# Patient Record
Sex: Female | Born: 1935 | Race: White | Hispanic: No | Smoking: Never smoker
Health system: Southern US, Community
[De-identification: ages and names within clinical notes are randomized; demographics above are authoritative.]

## PROBLEM LIST (undated history)

## (undated) DIAGNOSIS — K449 Diaphragmatic hernia without obstruction or gangrene: Secondary | ICD-10-CM

## (undated) DIAGNOSIS — K529 Noninfective gastroenteritis and colitis, unspecified: Secondary | ICD-10-CM

## (undated) DIAGNOSIS — R55 Syncope and collapse: Secondary | ICD-10-CM

## (undated) DIAGNOSIS — E871 Hypo-osmolality and hyponatremia: Secondary | ICD-10-CM

## (undated) DIAGNOSIS — R195 Other fecal abnormalities: Secondary | ICD-10-CM

## (undated) DIAGNOSIS — S52502A Unspecified fracture of the lower end of left radius, initial encounter for closed fracture: Secondary | ICD-10-CM

## (undated) DIAGNOSIS — N183 Chronic kidney disease, stage 3 unspecified: Secondary | ICD-10-CM

## (undated) DIAGNOSIS — R140 Abdominal distension (gaseous): Secondary | ICD-10-CM

## (undated) DIAGNOSIS — R7309 Other abnormal glucose: Secondary | ICD-10-CM

## (undated) DIAGNOSIS — M810 Age-related osteoporosis without current pathological fracture: Secondary | ICD-10-CM

## (undated) DIAGNOSIS — C439 Malignant melanoma of skin, unspecified: Secondary | ICD-10-CM

## (undated) DIAGNOSIS — I10 Essential (primary) hypertension: Secondary | ICD-10-CM

## (undated) DIAGNOSIS — R198 Other specified symptoms and signs involving the digestive system and abdomen: Secondary | ICD-10-CM

## (undated) DIAGNOSIS — F419 Anxiety disorder, unspecified: Secondary | ICD-10-CM

## (undated) DIAGNOSIS — E785 Hyperlipidemia, unspecified: Secondary | ICD-10-CM

## (undated) DIAGNOSIS — K219 Gastro-esophageal reflux disease without esophagitis: Secondary | ICD-10-CM

## (undated) DIAGNOSIS — E559 Vitamin D deficiency, unspecified: Secondary | ICD-10-CM

## (undated) HISTORY — DX: Abdominal distension (gaseous): R14.0

## (undated) HISTORY — DX: Other specified symptoms and signs involving the digestive system and abdomen: R19.8

## (undated) HISTORY — DX: Essential (primary) hypertension: I10

## (undated) HISTORY — PX: REPLACEMENT TOTAL KNEE: SUR1224

## (undated) HISTORY — DX: Malignant melanoma of skin, unspecified: C43.9

## (undated) HISTORY — DX: Noninfective gastroenteritis and colitis, unspecified: K52.9

## (undated) HISTORY — PX: WRIST SURGERY: SHX841

## (undated) HISTORY — PX: COLONOSCOPY: SHX174

## (undated) HISTORY — DX: Syncope and collapse: R55

## (undated) HISTORY — DX: Vitamin D deficiency, unspecified: E55.9

## (undated) HISTORY — DX: Age-related osteoporosis without current pathological fracture: M81.0

## (undated) HISTORY — PX: ABDOMINAL HYSTERECTOMY: SHX81

## (undated) HISTORY — DX: Hypo-osmolality and hyponatremia: E87.1

## (undated) HISTORY — DX: Unspecified fracture of the lower end of left radius, initial encounter for closed fracture: S52.502A

## (undated) HISTORY — PX: CATARACT EXTRACTION, BILATERAL: SHX1313

## (undated) HISTORY — DX: Gastro-esophageal reflux disease without esophagitis: K21.9

## (undated) HISTORY — DX: Anxiety disorder, unspecified: F41.9

## (undated) HISTORY — DX: Other fecal abnormalities: R19.5

## (undated) HISTORY — DX: Hyperlipidemia, unspecified: E78.5

## (undated) HISTORY — DX: Diaphragmatic hernia without obstruction or gangrene: K44.9

## (undated) HISTORY — DX: Chronic kidney disease, stage 3 unspecified: N18.30

## (undated) HISTORY — DX: Other abnormal glucose: R73.09

---

## 2002-05-26 HISTORY — PX: BREAST EXCISIONAL BIOPSY: SUR124

## 2010-07-03 ENCOUNTER — Other Ambulatory Visit: Payer: Self-pay | Admitting: Internal Medicine

## 2010-07-03 DIAGNOSIS — Z780 Asymptomatic menopausal state: Secondary | ICD-10-CM

## 2010-07-03 DIAGNOSIS — Z1231 Encounter for screening mammogram for malignant neoplasm of breast: Secondary | ICD-10-CM

## 2010-10-24 ENCOUNTER — Ambulatory Visit: Payer: Self-pay

## 2010-10-24 ENCOUNTER — Other Ambulatory Visit: Payer: Self-pay

## 2010-10-29 ENCOUNTER — Other Ambulatory Visit: Payer: Self-pay | Admitting: Family Medicine

## 2010-10-29 ENCOUNTER — Ambulatory Visit
Admission: RE | Admit: 2010-10-29 | Discharge: 2010-10-29 | Disposition: A | Discharge status: Home / Self Care | Payer: Federal, State, Local not specified - PPO | Source: Ambulatory Visit | Attending: Internal Medicine | Admitting: Internal Medicine

## 2010-10-29 ENCOUNTER — Ambulatory Visit
Admission: RE | Admit: 2010-10-29 | Discharge: 2010-10-29 | Disposition: A | Discharge status: Home / Self Care | Payer: Medicare Other | Source: Ambulatory Visit | Attending: Internal Medicine | Admitting: Internal Medicine

## 2010-10-29 DIAGNOSIS — Z780 Asymptomatic menopausal state: Secondary | ICD-10-CM

## 2010-10-29 DIAGNOSIS — Z1231 Encounter for screening mammogram for malignant neoplasm of breast: Secondary | ICD-10-CM

## 2011-10-21 ENCOUNTER — Other Ambulatory Visit: Payer: Self-pay | Admitting: Family Medicine

## 2011-10-21 DIAGNOSIS — Z1231 Encounter for screening mammogram for malignant neoplasm of breast: Secondary | ICD-10-CM

## 2011-11-05 ENCOUNTER — Ambulatory Visit
Admission: RE | Admit: 2011-11-05 | Discharge: 2011-11-05 | Disposition: A | Discharge status: Home / Self Care | Payer: Federal, State, Local not specified - PPO | Source: Ambulatory Visit | Attending: Family Medicine | Admitting: Family Medicine

## 2011-11-05 DIAGNOSIS — Z1231 Encounter for screening mammogram for malignant neoplasm of breast: Secondary | ICD-10-CM

## 2012-08-04 ENCOUNTER — Ambulatory Visit
Admission: RE | Admit: 2012-08-04 | Discharge: 2012-08-04 | Disposition: A | Discharge status: Home / Self Care | Payer: Federal, State, Local not specified - PPO | Source: Ambulatory Visit | Attending: Family Medicine | Admitting: Family Medicine

## 2012-08-04 ENCOUNTER — Other Ambulatory Visit: Payer: Self-pay | Admitting: Family Medicine

## 2012-08-04 DIAGNOSIS — M25552 Pain in left hip: Secondary | ICD-10-CM

## 2012-08-04 DIAGNOSIS — R05 Cough: Secondary | ICD-10-CM

## 2012-08-04 DIAGNOSIS — M25562 Pain in left knee: Secondary | ICD-10-CM

## 2012-08-04 IMAGING — CR DG CHEST 2V
2 series · 2 of 2 positions shown · non-contrast
Comparison: None.

CLINICAL DATA: Cough for 1 week

CHEST - 2 VIEW

[w chest pa]
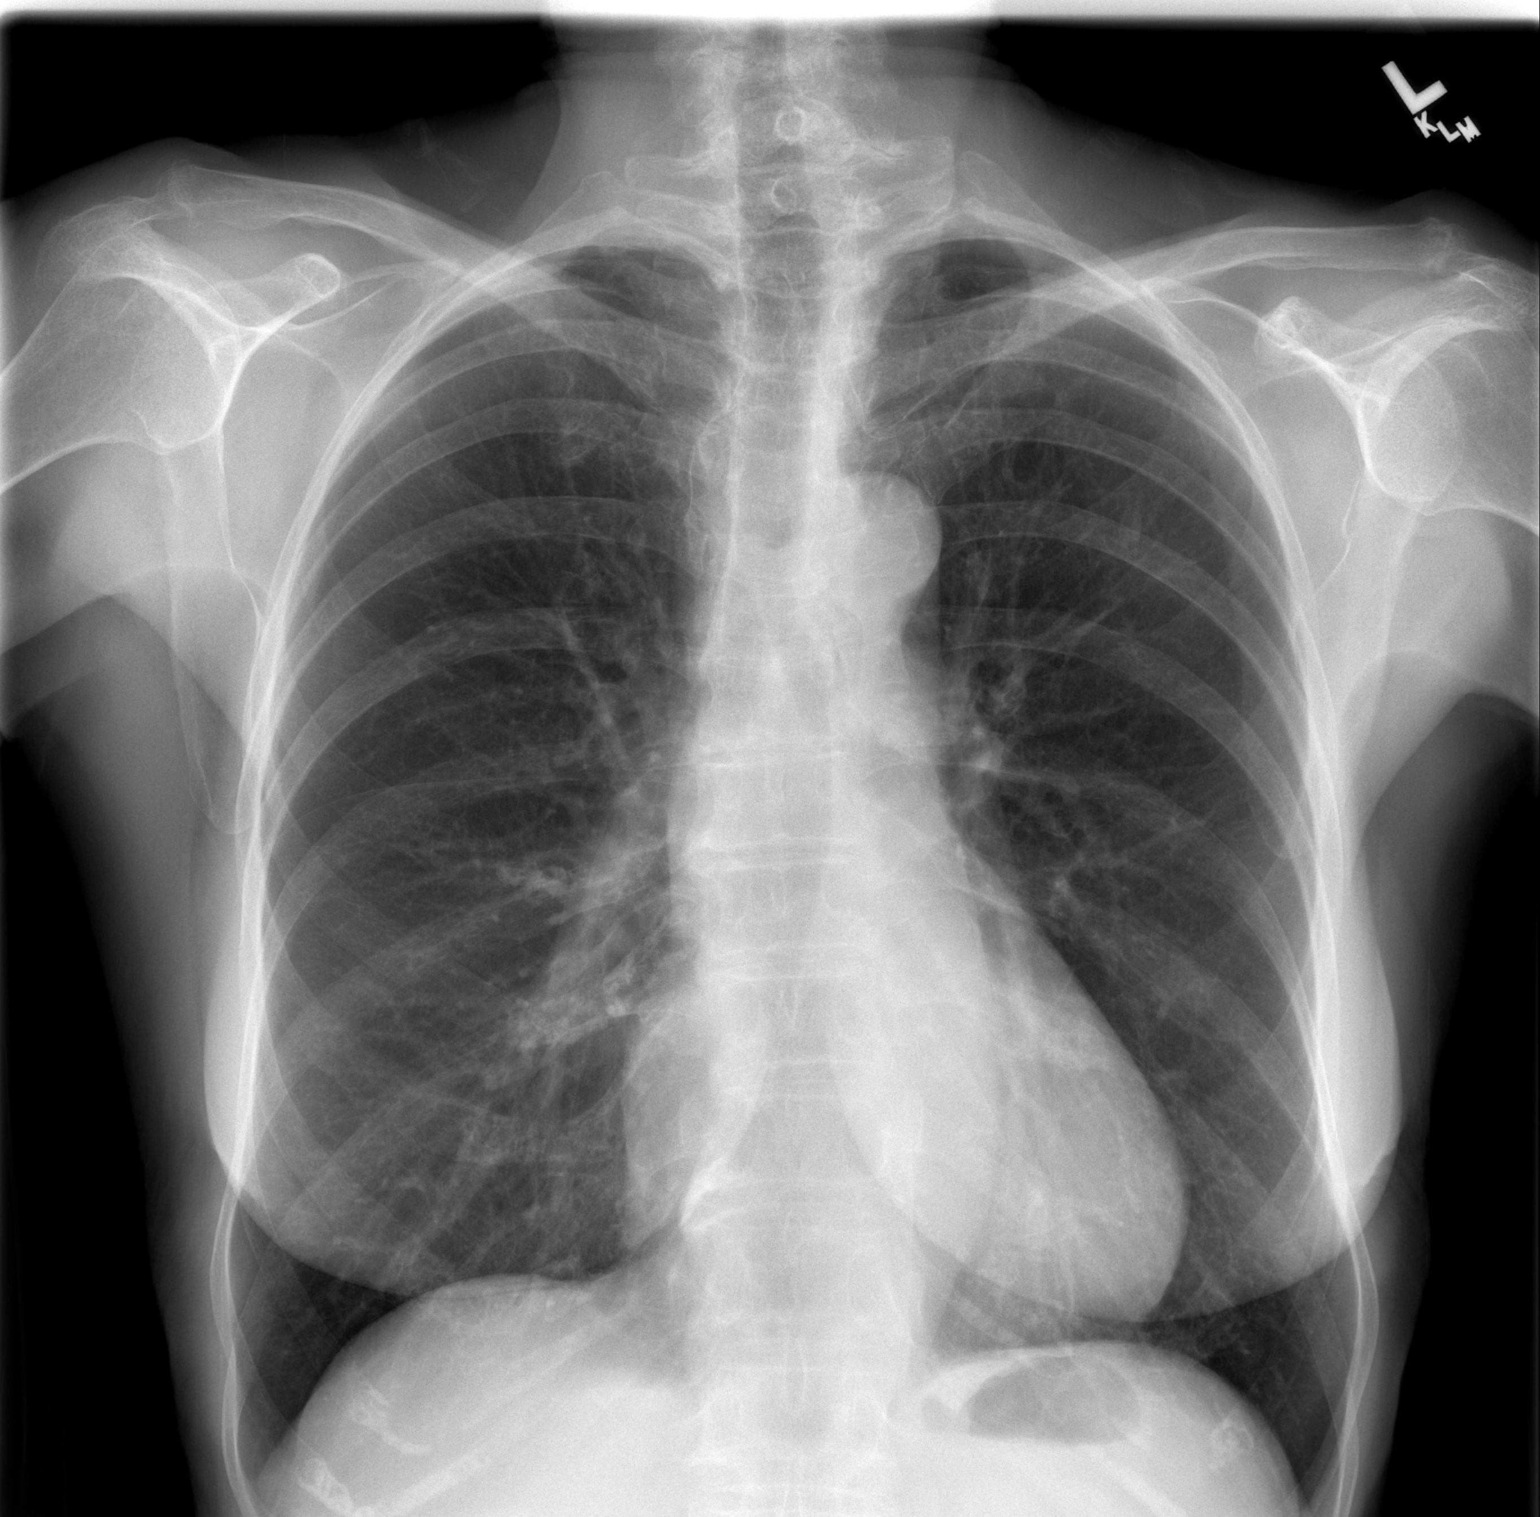

[w chest lat]
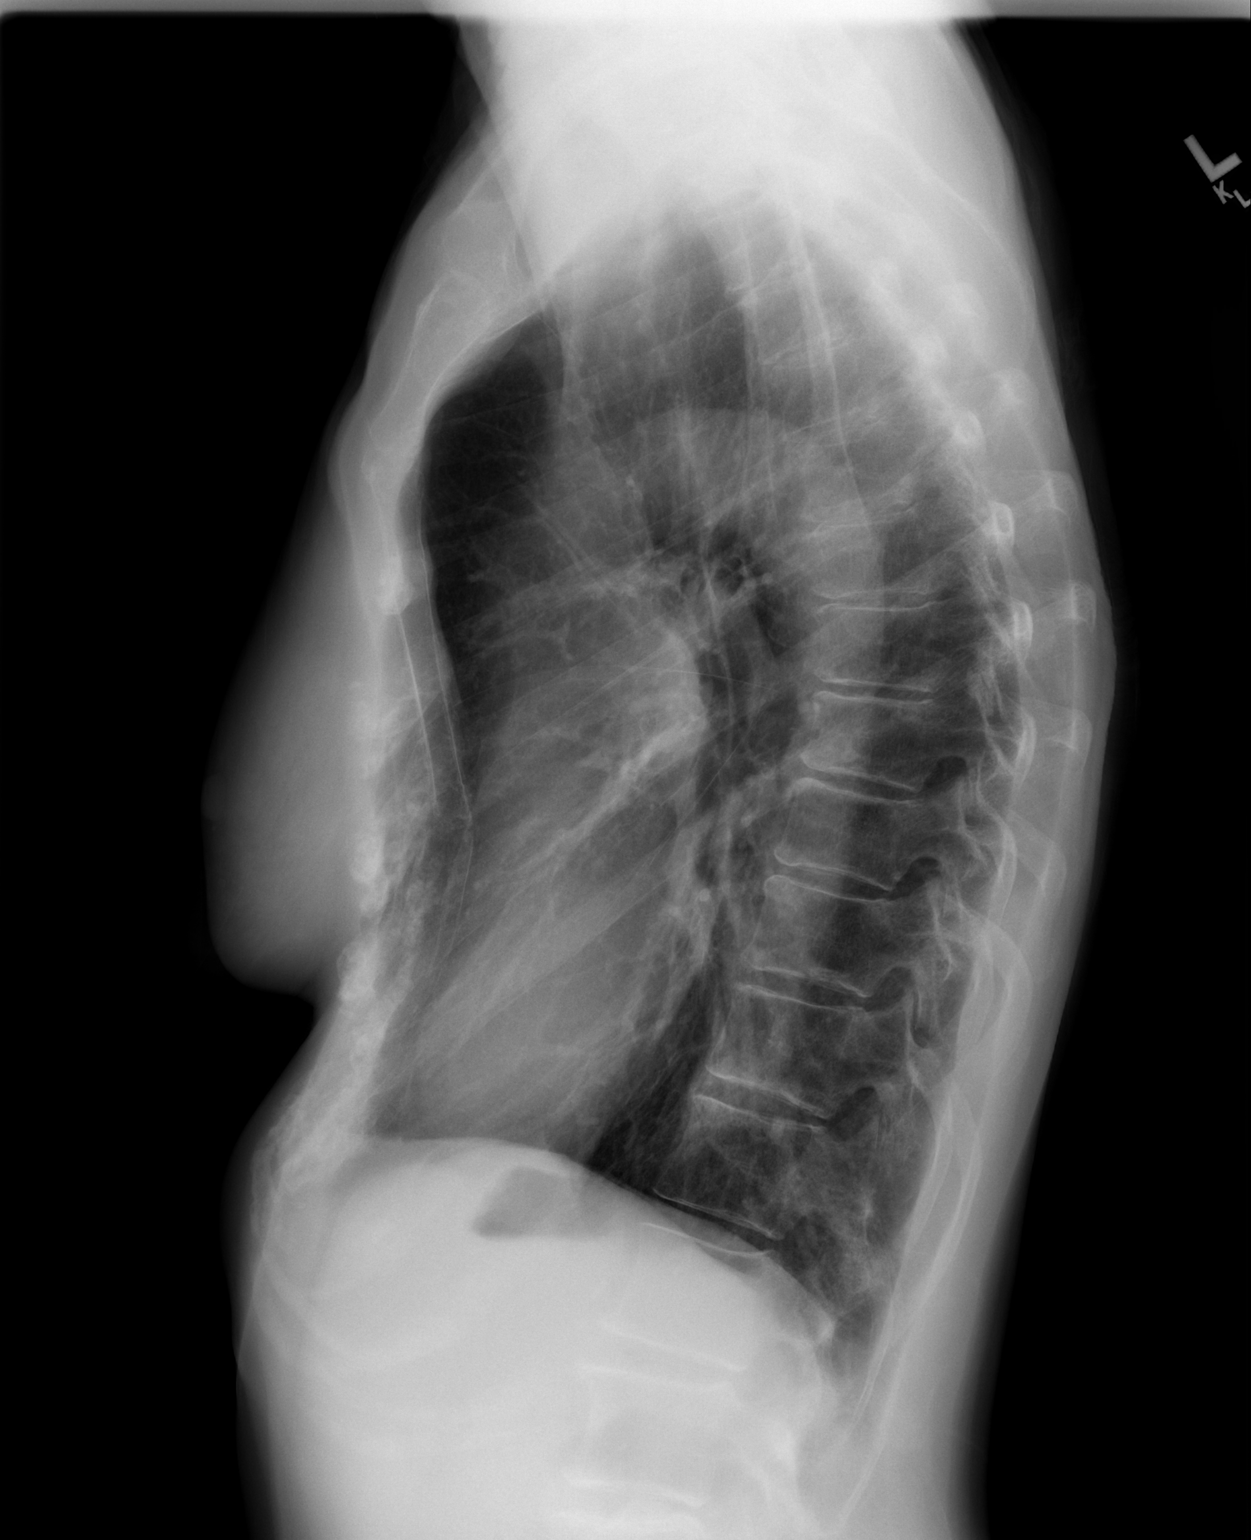

[2 of 2 positions shown; findings below may reference images not displayed]

FINDINGS: No active infiltrate or effusion is seen.  The lungs are
slightly hyperaerated.  Mediastinal contours appear normal.  The
heart is within upper limits of normal.  There does appear to be a
slight pectus excavatum present.  No acute bony abnormality is
seen.
IMPRESSION: 1.  No active lung disease.  Slight hyperaeration.
2.  Mild pectus excavatum.

## 2012-09-27 ENCOUNTER — Other Ambulatory Visit: Payer: Self-pay

## 2012-09-27 DIAGNOSIS — Z1231 Encounter for screening mammogram for malignant neoplasm of breast: Secondary | ICD-10-CM

## 2012-11-05 ENCOUNTER — Ambulatory Visit
Admission: RE | Admit: 2012-11-05 | Discharge: 2012-11-05 | Disposition: A | Discharge status: Home / Self Care | Payer: Federal, State, Local not specified - PPO | Source: Ambulatory Visit

## 2012-11-05 DIAGNOSIS — Z1231 Encounter for screening mammogram for malignant neoplasm of breast: Secondary | ICD-10-CM

## 2013-08-04 ENCOUNTER — Other Ambulatory Visit: Payer: Self-pay | Admitting: Family

## 2013-08-04 DIAGNOSIS — M8580 Other specified disorders of bone density and structure, unspecified site: Secondary | ICD-10-CM

## 2013-08-23 ENCOUNTER — Ambulatory Visit
Admission: RE | Admit: 2013-08-23 | Discharge: 2013-08-23 | Disposition: A | Discharge status: Home / Self Care | Payer: Federal, State, Local not specified - PPO | Source: Ambulatory Visit | Attending: Family | Admitting: Family

## 2013-08-23 DIAGNOSIS — M8580 Other specified disorders of bone density and structure, unspecified site: Secondary | ICD-10-CM

## 2013-10-24 ENCOUNTER — Other Ambulatory Visit: Payer: Self-pay

## 2013-10-24 DIAGNOSIS — Z1231 Encounter for screening mammogram for malignant neoplasm of breast: Secondary | ICD-10-CM

## 2013-11-15 ENCOUNTER — Encounter (INDEPENDENT_AMBULATORY_CARE_PROVIDER_SITE_OTHER): Payer: Self-pay

## 2013-11-15 ENCOUNTER — Ambulatory Visit
Admission: RE | Admit: 2013-11-15 | Discharge: 2013-11-15 | Disposition: A | Discharge status: Home / Self Care | Payer: Federal, State, Local not specified - PPO | Source: Ambulatory Visit

## 2013-11-15 DIAGNOSIS — Z1231 Encounter for screening mammogram for malignant neoplasm of breast: Secondary | ICD-10-CM

## 2014-10-20 ENCOUNTER — Other Ambulatory Visit: Payer: Self-pay

## 2014-10-20 DIAGNOSIS — Z1231 Encounter for screening mammogram for malignant neoplasm of breast: Secondary | ICD-10-CM

## 2014-11-17 ENCOUNTER — Ambulatory Visit
Admission: RE | Admit: 2014-11-17 | Discharge: 2014-11-17 | Disposition: A | Discharge status: Home / Self Care | Payer: Federal, State, Local not specified - PPO | Source: Ambulatory Visit

## 2014-11-17 DIAGNOSIS — Z1231 Encounter for screening mammogram for malignant neoplasm of breast: Secondary | ICD-10-CM

## 2015-07-07 ENCOUNTER — Observation Stay (HOSPITAL_COMMUNITY): Payer: Federal, State, Local not specified - PPO

## 2015-07-07 ENCOUNTER — Observation Stay (HOSPITAL_COMMUNITY)
Admission: EM | Admit: 2015-07-07 | Discharge: 2015-07-08 | Disposition: A | Discharge status: Home / Self Care | Payer: Federal, State, Local not specified - PPO | Attending: Orthopedic Surgery | Admitting: Orthopedic Surgery

## 2015-07-07 ENCOUNTER — Emergency Department (HOSPITAL_COMMUNITY): Payer: Federal, State, Local not specified - PPO

## 2015-07-07 ENCOUNTER — Encounter (HOSPITAL_COMMUNITY): Payer: Self-pay | Admitting: Emergency Medicine

## 2015-07-07 DIAGNOSIS — Z79899 Other long term (current) drug therapy: Secondary | ICD-10-CM | POA: Insufficient documentation

## 2015-07-07 DIAGNOSIS — M8580 Other specified disorders of bone density and structure, unspecified site: Secondary | ICD-10-CM | POA: Diagnosis not present

## 2015-07-07 DIAGNOSIS — W1830XA Fall on same level, unspecified, initial encounter: Secondary | ICD-10-CM | POA: Diagnosis not present

## 2015-07-07 DIAGNOSIS — S52572A Other intraarticular fracture of lower end of left radius, initial encounter for closed fracture: Principal | ICD-10-CM | POA: Insufficient documentation

## 2015-07-07 DIAGNOSIS — S52502A Unspecified fracture of the lower end of left radius, initial encounter for closed fracture: Secondary | ICD-10-CM

## 2015-07-07 DIAGNOSIS — S6992XA Unspecified injury of left wrist, hand and finger(s), initial encounter: Secondary | ICD-10-CM | POA: Diagnosis present

## 2015-07-07 DIAGNOSIS — Z01818 Encounter for other preprocedural examination: Secondary | ICD-10-CM

## 2015-07-07 HISTORY — DX: Unspecified fracture of the lower end of left radius, initial encounter for closed fracture: S52.502A

## 2015-07-07 LAB — CBC WITH DIFFERENTIAL/PLATELET
BASOS ABS: 0 10*3/uL (ref 0.0–0.1)
Basophils Relative: 1 %
EOS ABS: 0.1 10*3/uL (ref 0.0–0.7)
EOS PCT: 1 %
HCT: 45.8 % (ref 36.0–46.0)
Hemoglobin: 15.4 g/dL — ABNORMAL HIGH (ref 12.0–15.0)
LYMPHS PCT: 32 %
Lymphs Abs: 1.7 10*3/uL (ref 0.7–4.0)
MCH: 30.1 pg (ref 26.0–34.0)
MCHC: 33.6 g/dL (ref 30.0–36.0)
MCV: 89.5 fL (ref 78.0–100.0)
MONO ABS: 0.4 10*3/uL (ref 0.1–1.0)
Monocytes Relative: 8 %
Neutro Abs: 3.1 10*3/uL (ref 1.7–7.7)
Neutrophils Relative %: 58 %
PLATELETS: 185 10*3/uL (ref 150–400)
RBC: 5.12 MIL/uL — ABNORMAL HIGH (ref 3.87–5.11)
RDW: 13.3 % (ref 11.5–15.5)
WBC: 5.4 10*3/uL (ref 4.0–10.5)

## 2015-07-07 LAB — BASIC METABOLIC PANEL
ANION GAP: 12 (ref 5–15)
BUN: 13 mg/dL (ref 6–20)
CALCIUM: 9.6 mg/dL (ref 8.9–10.3)
CO2: 27 mmol/L (ref 22–32)
Chloride: 96 mmol/L — ABNORMAL LOW (ref 101–111)
Creatinine, Ser: 0.86 mg/dL (ref 0.44–1.00)
GFR calc Af Amer: >60 mL/min (ref >60)
GLUCOSE: 118 mg/dL — AB (ref 65–99)
Potassium: 3.9 mmol/L (ref 3.5–5.1)
Sodium: 135 mmol/L (ref 135–145)

## 2015-07-07 MED ORDER — FAMOTIDINE 20 MG PO TABS: 20.0000 mg | ORAL_TABLET | Freq: Two times a day (BID) | ORAL | Status: DC | PRN

## 2015-07-07 MED ORDER — METHOCARBAMOL 1000 MG/10ML IJ SOLN: 500.0000 mg | Freq: Four times a day (QID) | INTRAVENOUS | Status: DC | PRN

## 2015-07-07 MED ORDER — LACTATED RINGERS IV SOLN
INTRAVENOUS | Status: DC
  Administered 2015-07-08 (×2): via INTRAVENOUS

## 2015-07-07 MED ORDER — METHOCARBAMOL 500 MG PO TABS: 500.0000 mg | ORAL_TABLET | Freq: Four times a day (QID) | ORAL | Status: DC | PRN

## 2015-07-07 MED ORDER — DOCUSATE SODIUM 100 MG PO CAPS: 100.0000 mg | ORAL_CAPSULE | Freq: Two times a day (BID) | ORAL | Status: DC

## 2015-07-07 MED ORDER — ALPRAZOLAM 0.5 MG PO TABS
0.5000 mg | ORAL_TABLET | Freq: Four times a day (QID) | ORAL | Status: DC | PRN
  Administered 2015-07-08: 0.5 mg via ORAL
  Filled 2015-07-07: qty 1

## 2015-07-07 MED ORDER — MORPHINE SULFATE (PF) 2 MG/ML IV SOLN: 1.0000 mg | INTRAVENOUS | Status: DC | PRN

## 2015-07-07 MED ORDER — ONDANSETRON HCL 4 MG/2ML IJ SOLN: 4.0000 mg | Freq: Four times a day (QID) | INTRAMUSCULAR | Status: DC | PRN

## 2015-07-07 MED ORDER — LIDOCAINE HCL 2 % IJ SOLN
10.0000 mL | Freq: Once | INTRAMUSCULAR | Status: AC
  Administered 2015-07-07: 200 mg
  Filled 2015-07-07: qty 20

## 2015-07-07 MED ORDER — ONDANSETRON HCL 4 MG PO TABS: 4.0000 mg | ORAL_TABLET | Freq: Four times a day (QID) | ORAL | Status: DC | PRN

## 2015-07-07 MED ORDER — VITAMIN C 500 MG PO TABS: 1000.0000 mg | ORAL_TABLET | Freq: Every day | ORAL | Status: DC

## 2015-07-07 MED ORDER — OXYCODONE HCL 5 MG PO TABS
5.0000 mg | ORAL_TABLET | ORAL | Status: DC | PRN
Start: 2015-07-07 — End: 2015-07-08

## 2015-07-07 NOTE — H&P (Signed)
Ashley Solomon is an 81 y.o. female.   Chief Complaint: Left displaced distal radius fracture after a same level fall HPI: Left displaced distal radius fracture after same level fall with displacement. I've been asked to take over her care.  I discussed all issues. She denies neck back chest or nominal pain.  She is here with a friend. She lives with her husband. She is highly independent. She is 81 years of age. She is very healthy  History reviewed. No pertinent past medical history.  Past Surgical History  Procedure Laterality Date  . Replacement total knee Right   . Abdominal hysterectomy      No family history on file. Social History:  reports that she has never smoked. She does not have any smokeless tobacco history on file. She reports that she drinks alcohol. She reports that she does not use illicit drugs.  Allergies: No Known Allergies   (Not in a hospital admission)  No results found for this or any previous visit (from the past 48 hour(s)). Dg Wrist Complete Left  07/07/2015  CLINICAL DATA:  Recent fall on outstretched hand with pain and deformity EXAM: LEFT WRIST - COMPLETE 3+ VIEW COMPARISON:  None. FINDINGS: Comminuted fracture of the distal radius is noted with extension into the radiocarpal joint. Impaction and posterior angulation at the fracture site is noted as well. No definitive ulnar fracture is seen. No gross soft tissue abnormality seen. Carpal bones are intact pre IMPRESSION: Distal radial fracture with extension into the radiocarpal joint. Electronically Signed   By: Inez Catalina M.D.   On: 07/07/2015 17:55    Review of Systems  Constitutional: Negative.   HENT: Negative.   Eyes: Negative.   Respiratory: Negative.   Gastrointestinal: Negative.   Genitourinary: Negative.   Skin: Negative.   Neurological: Negative.   Endo/Heme/Allergies: Negative.   Psychiatric/Behavioral: Negative.     Blood pressure 187/99, pulse 90, temperature 98.9 F (37.2  C), temperature source Oral, resp. rate 18, height 5\' 6"  (1.676 m), weight 61.689 kg (136 lb), SpO2 96 %. Physical Exam  Patient has a displaced distal radius fracture with significant abnormality. She has no evidence of infection compartment syndrome or acute carpal tunnel syndrome. She is sensate has difficulty moving the fingers due to the significant fracture displacement.  I discussed her findings prepared for stem examination is benign right upper exam examination is benign no signs of infection.  The patient is alert and oriented in no acute distress. The patient complains of pain in the affected upper extremity.  The patient is noted to have a normal HEENT exam. Lung fields show equal chest expansion and no shortness of breath. Abdomen exam is nontender without distention. Lower extremity examination does not show any fracture dislocation or blood clot symptoms. Pelvis is stable and the neck and back are stable and nontender. Assessment/Plan Left distal raised fracture acutely displaced.  Procedure patient was given a hematoma block under sterile conditions after prep and drape following this underwent manipulative reduction. Fluoroscopy was performed in the room and excellent position on AP and lateral plane were observed. I was pleased with AP and lateral plane and the reduction.  Unfortunately she will have progressed mandatory collapse given her age maturity level and degree of osteopenia.  I discussed her definitive 6 sedation tomorrow morning and she desires to proceed.  I discussed risks and benefits of surgery. Our goal is to give her her geometry back with stabilization of the distal radius. Benefits were discussed  and all questions have been encouraged and answered. We are planning surgery for your upper extremity. The risk and benefits of surgery to include risk of bleeding, infection, anesthesia,  damage to normal structures and failure of the surgery to accomplish its  intended goals of relieving symptoms and restoring function have been discussed in detail. With this in mind we plan to proceed. I have specifically discussed with the patient the pre-and postoperative regime and the dos and don'ts and risk and benefits in great detail. Risk and benefits of surgery also include risk of dystrophy(CRPS), chronic nerve pain, failure of the healing process to go onto completion and other inherent risks of surgery The relavent the pathophysiology of the disease/injury process, as well as the alternatives for treatment and postoperative course of action has been discussed in great detail with the patient who desires to proceed.  We will do everything in our power to help you (the patient) restore function to the upper extremity. It is a pleasure to see this patient today.  Paulene Floor, MD 07/07/2015, 9:40 PM

## 2015-07-07 NOTE — Progress Notes (Signed)
Orthopedic Tech Progress Note Patient Details:  Genel Kucera April 24, 1935 [id number] Assisted Dr. Amedeo Plenty with fx. reduction and assisted with placement of fiberglass sugar tong splint to LUE.  Pulses, sensation, motion intact before and after splinting.  Capillary refill less than 2 seconds before and after splinting.   Placed splinted LUE in arm sling. Ortho Devices Type of Ortho Device: Sugartong splint, Arm sling Ortho Device/Splint Location: LLE Ortho Device/Splint Interventions: Application   Darrol Poke 07/07/2015, 10:51 PM

## 2015-07-07 NOTE — ED Notes (Signed)
Pt reports slip and fall about 1 hour PTA causing injury to left wrist. Pt has ice on at present.

## 2015-07-07 NOTE — ED Notes (Signed)
Hand surgeon at bedside. 

## 2015-07-07 NOTE — ED Provider Notes (Signed)
CSN: [id number]     Arrival date & time 07/07/15  1641 History   First MD Initiated Contact with Patient 07/07/15 1912     Chief Complaint  Patient presents with  . Fall  . Wrist Injury      Patient is a 81 y.o. female presenting with fall and wrist injury. The history is provided by the patient.  Fall This is a new problem. Pertinent negatives include no abdominal pain and no shortness of breath.  Wrist Injury  patient had a mechanical fall and landed on her left wrist. There is a deformity but skin is intact. No other injury. No numbness weakness. She has not seen a hand surgeon before. No other injury. No headache. She is not on anticoagulation.  History reviewed. No pertinent past medical history. Past Surgical History  Procedure Laterality Date  . Replacement total knee Right   . Abdominal hysterectomy     History reviewed. No pertinent family history. Social History  Substance Use Topics  . Smoking status: Never Smoker   . Smokeless tobacco: None  . Alcohol Use: Yes     Comment: seldom   OB History    No data available     Review of Systems  Constitutional: Negative for appetite change.  Respiratory: Negative for shortness of breath.   Gastrointestinal: Negative for abdominal pain.  Musculoskeletal: Positive for joint swelling.  Skin: Negative for wound.  Neurological: Negative for weakness and numbness.      Allergies  Review of patient's allergies indicates no known allergies.  Home Medications   Prior to Admission medications   Medication Sig Start Date End Date Taking? Authorizing Provider  Cholecalciferol (VITAMIN D PO) Take 1 capsule by mouth daily.   Yes Historical Provider, MD  DULoxetine (CYMBALTA) 30 MG capsule Take 30 mg by mouth daily. 06/22/15  Yes Historical Provider, MD  Fish Oil-Cholecalciferol (FISH OIL + D3 PO) Take 1 capsule by mouth daily.   Yes Historical Provider, MD  vitamin B-12 (CYANOCOBALAMIN) 250 MCG tablet Take 250 mcg by mouth  daily.   Yes Historical Provider, MD  cephALEXin (KEFLEX) 500 MG capsule Take 1 capsule (500 mg total) by mouth 4 (four) times daily. 07/08/15   Roseanne Kaufman, MD  oxyCODONE (OXY IR/ROXICODONE) 5 MG immediate release tablet Take 2 tablets (10 mg total) by mouth every 4 (four) hours as needed for moderate pain or severe pain. 07/08/15   Roseanne Kaufman, MD   BP 151/78 mmHg  Pulse 77  Temp(Src) 98.8 F (37.1 C) (Oral)  Resp 17  Ht 5\' 6"  (1.676 m)  Wt 135 lb 2.3 oz (61.3 kg)  BMI 21.82 kg/m2  SpO2 94% Physical Exam  Constitutional: She appears well-developed.  HENT:  Head: Atraumatic.  Eyes: EOM are normal.  Neck: Neck supple.  Cardiovascular: Normal rate.   Pulmonary/Chest: Effort normal.  Abdominal: Soft.  Musculoskeletal: She exhibits tenderness.  Tenderness and deformity of left wrist with dorsal angulation. Skin intact. Neurovascular intact over radial median and ulnar distribution of the hand. No tenderness over her elbow or shoulder.  Skin: Skin is warm.    ED Course  Procedures (including critical care time) Labs Review Labs Reviewed  CBC WITH DIFFERENTIAL/PLATELET - Abnormal; Notable for the following:    RBC 5.12 (*)    Hemoglobin 15.4 (*)    All other components within normal limits  BASIC METABOLIC PANEL - Abnormal; Notable for the following:    Chloride 96 (*)    Glucose, Bld 118 (*)  All other components within normal limits  MRSA PCR SCREENING    Imaging Review Dg Chest 2 View  07/07/2015  CLINICAL DATA:  Preoperative evaluation for wrist surgery. EXAM: CHEST  2 VIEW COMPARISON:  None. FINDINGS: The cardiac silhouette appears mildly enlarged, attributed to pectus excavatum. Mediastinal silhouette is nonsuspicious. Mild chronic interstitial changes and increased lung volumes suggest mild COPD. No pleural effusion or focal consolidation. No pneumothorax. Soft tissue planes and included osseous structure nonsuspicious, patient appears osteopenic. IMPRESSION: Mild  probable COPD without superimposed acute cardiopulmonary process. Electronically Signed   By: Elon Alas M.D.   On: 07/07/2015 22:31   Dg Wrist Complete Left  07/07/2015  CLINICAL DATA:  Recent fall on outstretched hand with pain and deformity EXAM: LEFT WRIST - COMPLETE 3+ VIEW COMPARISON:  None. FINDINGS: Comminuted fracture of the distal radius is noted with extension into the radiocarpal joint. Impaction and posterior angulation at the fracture site is noted as well. No definitive ulnar fracture is seen. No gross soft tissue abnormality seen. Carpal bones are intact pre IMPRESSION: Distal radial fracture with extension into the radiocarpal joint. Electronically Signed   By: Inez Catalina M.D.   On: 07/07/2015 17:55   I have personally reviewed and evaluated these images and lab results as part of my medical decision-making.   EKG Interpretation   Date/Time:  Saturday July 07 2015 21:55:43 EST Ventricular Rate:  76 PR Interval:  174 QRS Duration: 87 QT Interval:  411 QTC Calculation: 462 R Axis:   64 Text Interpretation:  Sinus rhythm Biatrial enlargement ED PHYSICIAN  INTERPRETATION AVAILABLE IN [facility] Confirmed by TEST, Record  ([id number]) on 07/08/2015 11:07:26 AM      MDM   Final diagnoses:  Distal radius fracture    Patient with fall and wrist fracture. Seed by Dr Amedeo Plenty and reduced in ED. Admit for surgery tomorrow.     Davonna Belling, MD 07/08/15 1501

## 2015-07-08 ENCOUNTER — Encounter (HOSPITAL_COMMUNITY)
Admission: EM | Disposition: A | Discharge status: Home / Self Care | Payer: Self-pay | Source: Home / Self Care | Attending: Emergency Medicine

## 2015-07-08 ENCOUNTER — Observation Stay (HOSPITAL_COMMUNITY): Payer: Federal, State, Local not specified - PPO | Admitting: Anesthesiology

## 2015-07-08 ENCOUNTER — Inpatient Hospital Stay: Admit: 2015-07-08 | Payer: Self-pay | Admitting: Orthopedic Surgery

## 2015-07-08 ENCOUNTER — Encounter (HOSPITAL_COMMUNITY): Payer: Self-pay | Admitting: Orthopedic Surgery

## 2015-07-08 HISTORY — PX: ORIF WRIST FRACTURE: SHX2133

## 2015-07-08 LAB — MRSA PCR SCREENING: MRSA BY PCR: NEGATIVE

## 2015-07-08 SURGERY — OPEN REDUCTION INTERNAL FIXATION (ORIF) WRIST FRACTURE
Anesthesia: Monitor Anesthesia Care | Site: Wrist | Laterality: Left

## 2015-07-08 MED ORDER — 0.9 % SODIUM CHLORIDE (POUR BTL) OPTIME
TOPICAL | Status: DC | PRN
  Administered 2015-07-08: 1000 mL

## 2015-07-08 MED ORDER — OXYCODONE HCL 5 MG/5ML PO SOLN: 5.0000 mg | Freq: Once | ORAL | Status: DC | PRN

## 2015-07-08 MED ORDER — MIDAZOLAM HCL 2 MG/2ML IJ SOLN
INTRAMUSCULAR | Status: AC
  Filled 2015-07-08: qty 2

## 2015-07-08 MED ORDER — ROCURONIUM BROMIDE 50 MG/5ML IV SOLN
INTRAVENOUS | Status: AC
  Filled 2015-07-08: qty 1

## 2015-07-08 MED ORDER — BUPIVACAINE-EPINEPHRINE (PF) 0.5% -1:200000 IJ SOLN
INTRAMUSCULAR | Status: DC | PRN
  Administered 2015-07-08: 30 mL via PERINEURAL

## 2015-07-08 MED ORDER — MIDAZOLAM HCL 5 MG/5ML IJ SOLN
INTRAMUSCULAR | Status: DC | PRN
  Administered 2015-07-08: 1 mg via INTRAVENOUS

## 2015-07-08 MED ORDER — ACETAMINOPHEN 160 MG/5ML PO SOLN
325.0000 mg | ORAL | Status: DC | PRN
  Filled 2015-07-08: qty 20.3

## 2015-07-08 MED ORDER — SUCCINYLCHOLINE CHLORIDE 20 MG/ML IJ SOLN
INTRAMUSCULAR | Status: AC
  Filled 2015-07-08: qty 1

## 2015-07-08 MED ORDER — OXYCODONE HCL 5 MG PO TABS: 5.0000 mg | ORAL_TABLET | Freq: Once | ORAL | Status: DC | PRN

## 2015-07-08 MED ORDER — CEFAZOLIN SODIUM-DEXTROSE 2-3 GM-% IV SOLR
INTRAVENOUS | Status: AC
  Filled 2015-07-08: qty 50

## 2015-07-08 MED ORDER — CEFAZOLIN SODIUM-DEXTROSE 2-3 GM-% IV SOLR
2.0000 g | INTRAVENOUS | Status: AC
  Administered 2015-07-08: 2000 mg via INTRAVENOUS
  Filled 2015-07-08: qty 50

## 2015-07-08 MED ORDER — LIDOCAINE HCL (CARDIAC) 20 MG/ML IV SOLN
INTRAVENOUS | Status: DC | PRN
  Administered 2015-07-08: 60 mg via INTRAVENOUS

## 2015-07-08 MED ORDER — MEPIVACAINE HCL 1.5 % IJ SOLN
INTRAMUSCULAR | Status: DC | PRN
  Administered 2015-07-08: 10 mL via PERINEURAL

## 2015-07-08 MED ORDER — FENTANYL CITRATE (PF) 100 MCG/2ML IJ SOLN: 25.0000 ug | INTRAMUSCULAR | Status: DC | PRN

## 2015-07-08 MED ORDER — FENTANYL CITRATE (PF) 250 MCG/5ML IJ SOLN
INTRAMUSCULAR | Status: AC
  Filled 2015-07-08: qty 5

## 2015-07-08 MED ORDER — PROPOFOL 10 MG/ML IV BOLUS
INTRAVENOUS | Status: AC
  Filled 2015-07-08: qty 20

## 2015-07-08 MED ORDER — ACETAMINOPHEN 325 MG PO TABS: 325.0000 mg | ORAL_TABLET | ORAL | Status: DC | PRN

## 2015-07-08 MED ORDER — PROPOFOL 500 MG/50ML IV EMUL
INTRAVENOUS | Status: DC | PRN
  Administered 2015-07-08: 75 ug/kg/min via INTRAVENOUS

## 2015-07-08 MED ORDER — FENTANYL CITRATE (PF) 100 MCG/2ML IJ SOLN
INTRAMUSCULAR | Status: DC | PRN
  Administered 2015-07-08: 100 ug via INTRAVENOUS

## 2015-07-08 MED ORDER — CEPHALEXIN 500 MG PO CAPS: 500.0000 mg | ORAL_CAPSULE | Freq: Four times a day (QID) | ORAL | Status: DC

## 2015-07-08 MED ORDER — OXYCODONE HCL 5 MG PO TABS: 10.0000 mg | ORAL_TABLET | ORAL | Status: DC | PRN

## 2015-07-08 SURGICAL SUPPLY — 66 items
BANDAGE ELASTIC 3 VELCRO ST LF (GAUZE/BANDAGES/DRESSINGS) ×3 IMPLANT
BANDAGE ELASTIC 4 VELCRO ST LF (GAUZE/BANDAGES/DRESSINGS) ×3 IMPLANT
BIT DRILL 2.2 SS TIBIAL (BIT) ×3 IMPLANT
BLADE SURG ROTATE 9660 (MISCELLANEOUS) IMPLANT
BNDG CONFORM 2 STRL LF (GAUZE/BANDAGES/DRESSINGS) ×3 IMPLANT
BNDG ESMARK 4X9 LF (GAUZE/BANDAGES/DRESSINGS) ×3 IMPLANT
BNDG GAUZE ELAST 4 BULKY (GAUZE/BANDAGES/DRESSINGS) ×3 IMPLANT
CANISTER SUCTION 2500CC (MISCELLANEOUS) ×3 IMPLANT
CORDS BIPOLAR (ELECTRODE) ×3 IMPLANT
COVER SURGICAL LIGHT HANDLE (MISCELLANEOUS) ×3 IMPLANT
CUFF TOURNIQUET SINGLE 18IN (TOURNIQUET CUFF) ×3 IMPLANT
CUFF TOURNIQUET SINGLE 24IN (TOURNIQUET CUFF) IMPLANT
DRAIN TLS ROUND 10FR (DRAIN) IMPLANT
DRAPE OEC MINIVIEW 54X84 (DRAPES) ×3 IMPLANT
DRAPE SURG 17X23 STRL (DRAPES) ×3 IMPLANT
DRSG ADAPTIC 3X8 NADH LF (GAUZE/BANDAGES/DRESSINGS) ×3 IMPLANT
DRSG EMULSION OIL 3X3 NADH (GAUZE/BANDAGES/DRESSINGS) ×3 IMPLANT
GAUZE SPONGE 4X4 12PLY STRL (GAUZE/BANDAGES/DRESSINGS) IMPLANT
GAUZE XEROFORM 1X8 LF (GAUZE/BANDAGES/DRESSINGS) IMPLANT
GAUZE XEROFORM 5X9 LF (GAUZE/BANDAGES/DRESSINGS) ×3 IMPLANT
GLOVE BIO SURGEON STRL SZ7 (GLOVE) ×3 IMPLANT
GLOVE BIOGEL M STRL SZ7.5 (GLOVE) ×3 IMPLANT
GLOVE BIOGEL PI IND STRL 7.0 (GLOVE) ×1 IMPLANT
GLOVE BIOGEL PI INDICATOR 7.0 (GLOVE) ×2
GLOVE SS BIOGEL STRL SZ 8 (GLOVE) ×2 IMPLANT
GLOVE SUPERSENSE BIOGEL SZ 8 (GLOVE) ×4
GOWN STRL REUS W/ TWL LRG LVL3 (GOWN DISPOSABLE) ×1 IMPLANT
GOWN STRL REUS W/ TWL XL LVL3 (GOWN DISPOSABLE) ×2 IMPLANT
GOWN STRL REUS W/TWL LRG LVL3 (GOWN DISPOSABLE) ×2
GOWN STRL REUS W/TWL XL LVL3 (GOWN DISPOSABLE) ×4
KIT BASIN OR (CUSTOM PROCEDURE TRAY) ×3 IMPLANT
KIT ROOM TURNOVER OR (KITS) ×3 IMPLANT
LOOP VESSEL MAXI BLUE (MISCELLANEOUS) IMPLANT
MANIFOLD NEPTUNE II (INSTRUMENTS) ×3 IMPLANT
NEEDLE 22X1 1/2 (OR ONLY) (NEEDLE) IMPLANT
NS IRRIG 1000ML POUR BTL (IV SOLUTION) ×3 IMPLANT
PACK ORTHO EXTREMITY (CUSTOM PROCEDURE TRAY) ×3 IMPLANT
PAD ARMBOARD 7.5X6 YLW CONV (MISCELLANEOUS) ×6 IMPLANT
PAD CAST 3X4 CTTN HI CHSV (CAST SUPPLIES) IMPLANT
PAD CAST 4YDX4 CTTN HI CHSV (CAST SUPPLIES) ×1 IMPLANT
PADDING CAST COTTON 3X4 STRL (CAST SUPPLIES)
PADDING CAST COTTON 4X4 STRL (CAST SUPPLIES) ×2
PEG LOCKING SMOOTH 2.2X18 (Peg) ×6 IMPLANT
PEG LOCKING SMOOTH 2.2X20 (Screw) ×9 IMPLANT
PLATE NARROW DVR LEFT (Plate) ×3 IMPLANT
SCREW LOCK 12X2.7X 3 LD (Screw) ×1 IMPLANT
SCREW LOCK 14X2.7X 3 LD TPR (Screw) ×2 IMPLANT
SCREW LOCK 18X2.7X 3 LD TPR (Screw) ×1 IMPLANT
SCREW LOCKING 2.7X12MM (Screw) ×2 IMPLANT
SCREW LOCKING 2.7X13MM (Screw) ×3 IMPLANT
SCREW LOCKING 2.7X14 (Screw) ×4 IMPLANT
SCREW LOCKING 2.7X18 (Screw) ×2 IMPLANT
SPLINT FIBERGLASS 4X30 (CAST SUPPLIES) ×3 IMPLANT
SPONGE GAUZE 4X4 12PLY STER LF (GAUZE/BANDAGES/DRESSINGS) ×3 IMPLANT
SPONGE LAP 4X18 X RAY DECT (DISPOSABLE) IMPLANT
SUT MNCRL AB 4-0 PS2 18 (SUTURE) IMPLANT
SUT PROLENE 3 0 PS 2 (SUTURE) IMPLANT
SUT VIC AB 3-0 FS2 27 (SUTURE) ×3 IMPLANT
SYR CONTROL 10ML LL (SYRINGE) IMPLANT
SYSTEM CHEST DRAIN TLS 7FR (DRAIN) ×3 IMPLANT
TOWEL OR 17X24 6PK STRL BLUE (TOWEL DISPOSABLE) ×3 IMPLANT
TOWEL OR 17X26 10 PK STRL BLUE (TOWEL DISPOSABLE) ×3 IMPLANT
TUBE CONNECTING 12'X1/4 (SUCTIONS) ×1
TUBE CONNECTING 12X1/4 (SUCTIONS) ×2 IMPLANT
TUBE EVACUATION TLS (MISCELLANEOUS) IMPLANT
WATER STERILE IRR 1000ML POUR (IV SOLUTION) ×3 IMPLANT

## 2015-07-08 NOTE — Discharge Summary (Signed)
  Patient has been seen and examined. Patient has pain appropriate to his injury/process. Patient denies new complaints at this present time. I have discussed the care pathway with nursing staff. Patient is appropriate and alert.  We reviewed vital signs and intake output which are stable.  The upper extremity is neurovascularly intact. Refill is normal. There is no signs of compartment syndrome. There is no signs of dystrophy. There is normal sensation.  I have spent a  great deal of time discussing range of motion edema control and other techniques to decrease edema and promote flexion extension of the fingers. Patient understands the importance of elevation range of motion massage and other measures to lessen pain and prevent swelling.  We have also discussed immobilization to appropriate areas involved.  We have discussed with the patient shoulder range of motion to prevent adhesive capsulitis.  The remainder of the examination is normal today without complicating feature.  Drain was removed without difficulty  Patient will be discharged home. Will plan to see the patient back in the office as per discharge instructions (please see discharge instructions).  Patient had an uneventful hospital course. At the time of discharge patient is stable awake alert and oriented in no acute distress. Regular diet will be continued and has been tolerated. Patient will notify should have problems occur. There is no signs of DVT infection or other complication at this juncture.  All questions have been incurred and answered.  Please see discharge med list Discharge diagnosis status post open reduction internal fixation left comminuted grade 3 part radius fracture Ashley Goodwill MD

## 2015-07-08 NOTE — Anesthesia Postprocedure Evaluation (Signed)
Anesthesia Post Note  Patient: Ashley Solomon  Procedure(s) Performed: Procedure(s) (LRB): OPEN REDUCTION INTERNAL FIXATION (ORIF) WRIST FRACTURE (Left)  Patient location during evaluation: PACU Anesthesia Type: Regional and MAC Level of consciousness: awake Pain management: pain level controlled Vital Signs Assessment: post-procedure vital signs reviewed and stable Respiratory status: spontaneous breathing Cardiovascular status: stable Postop Assessment: no signs of nausea or vomiting Anesthetic complications: no    Last Vitals:  Filed Vitals:   07/08/15 1221 07/08/15 1235  BP: 150/87 166/91  Pulse:  81  Temp:    Resp: 12 14    Last Pain:  Filed Vitals:   07/08/15 1241  PainSc: Asleep                 Noralee Dutko

## 2015-07-08 NOTE — H&P (Signed)
  Plan ORIF All questions addressed We are planning surgery for your upper extremity. The risk and benefits of surgery to include risk of bleeding, infection, anesthesia,  damage to normal structures and failure of the surgery to accomplish its intended goals of relieving symptoms and restoring function have been discussed in detail. With this in mind we plan to proceed. I have specifically discussed with the patient the pre-and postoperative regime and the dos and don'ts and risk and benefits in great detail. Risk and benefits of surgery also include risk of dystrophy(CRPS), chronic nerve pain, failure of the healing process to go onto completion and other inherent risks of surgery The relavent the pathophysiology of the disease/injury process, as well as the alternatives for treatment and postoperative course of action has been discussed in great detail with the patient who desires to proceed.  We will do everything in our power to help you (the patient) restore function to the upper extremity. It is a pleasure to see this patient today. Zevin Nevares MD

## 2015-07-08 NOTE — Transfer of Care (Signed)
Immediate Anesthesia Transfer of Care Note  Patient: Ashley Solomon  Procedure(s) Performed: Procedure(s): OPEN REDUCTION INTERNAL FIXATION (ORIF) WRIST FRACTURE (Left)  Patient Location: PACU  Anesthesia Type:MAC  Level of Consciousness: awake, alert  and oriented  Airway & Oxygen Therapy: Patient Spontanous Breathing  Post-op Assessment: Post -op Vital signs reviewed and stable  Post vital signs: stable  Last Vitals:  Filed Vitals:   07/08/15 0515 07/08/15 0900  BP: 142/78 168/97  Pulse: 68 77  Temp: 36.6 C 37.1 C  Resp: 16     Complications: No apparent anesthesia complications

## 2015-07-08 NOTE — Anesthesia Preprocedure Evaluation (Signed)
Anesthesia Evaluation  Patient identified by MRN, date of birth, ID band Patient awake    Reviewed: Allergy & Precautions, NPO status , Patient's Chart, lab work & pertinent test results  History of Anesthesia Complications Negative for: history of anesthetic complications  Airway Mallampati: I  TM Distance: >3 FB Neck ROM: Full    Dental  (+) Upper Dentures, Teeth Intact   Pulmonary neg pulmonary ROS,    breath sounds clear to auscultation       Cardiovascular negative cardio ROS   Rhythm:Regular     Neuro/Psych negative neurological ROS     GI/Hepatic negative GI ROS, Neg liver ROS,   Endo/Other  negative endocrine ROS  Renal/GU negative Renal ROS     Musculoskeletal   Abdominal   Peds  Hematology negative hematology ROS (+)   Anesthesia Other Findings   Reproductive/Obstetrics                             Anesthesia Physical Anesthesia Plan  ASA: II  Anesthesia Plan: MAC and Regional   Post-op Pain Management:    Induction: Intravenous  Airway Management Planned: Nasal Cannula and Natural Airway  Additional Equipment: None  Intra-op Plan:   Post-operative Plan:   Informed Consent: I have reviewed the patients History and Physical, chart, labs and discussed the procedure including the risks, benefits and alternatives for the proposed anesthesia with the patient or authorized representative who has indicated his/her understanding and acceptance.   Dental advisory given  Plan Discussed with: CRNA and Surgeon  Anesthesia Plan Comments:         Anesthesia Quick Evaluation

## 2015-07-08 NOTE — Op Note (Signed)
Ashley Solomon, Ashley Solomon            ACCOUNT NO.:  [id number]  MEDICAL RECORD NO.:  [id number]  LOCATION:  [facility]                         FACILITY:  [facility]  PHYSICIAN:  Satira Anis. Mariany Mackintosh, M.D.DATE OF BIRTH:  [birth year]  DATE OF PROCEDURE: DATE OF DISCHARGE:                              OPERATIVE REPORT   PREOPERATIVE DIAGNOSIS:  Comminuted complex left distal radius fracture greater than 3-part intra-articular.  POSTOPERATIVE DIAGNOSIS:  Comminuted complex left distal radius fracture greater than 3-part intra-articular.  PROCEDURE: 1. Open reduction and internal fixation, greater than 3-part intra-     articular distal radius fracture with DVR plate and screw construct     (CrossLock set from [employer] utilizing a narrow plate). 2. AP lateral and oblique x-ray was performed, examined, and     interpreted by myself.  SURGEON:  Satira Anis. Amedeo Plenty, MD  ASSISTANT:  None.  COMPLICATIONS:  None.  DRAINS:  One.  INDICATIONS:  A 81 year old female with the above-mentioned diagnosis.  She was stabilized last night with a manipulative reduction.  However, given the osteopenia and fracture pattern, I feel that certainly she has a high tendency towards progressive angulatory displacement and would recommend surgical endeavors.  OPERATION IN DETAIL:  Patient was seen by myself and Anesthesia.  She was taken to the operative theater after a block by Dr. Ermalene Postin.  Once this was complete, we then performed very careful and cautious prescrubbed with Hibiclens followed by a surgical Betadine scrub, and paint.  The patient tolerated this well.  Sterile field was secured. Arm was elevated, tourniquet was insufflated to 250 mmHg and volar radial incision was made after time-out was called and Ancef was given. Dissection was carried down through skin with knife blade.  FCR tendon sheath was incised palmarly and dorsally.  Following this, I dissected down very carefully, elevated the pronator and  retracted the pronator in the carpal canal contents ulnarly identified the fracture site, reduced the fracture and then applied a narrow DVR plate and screw construct. This allowed recreation of radial height inclination and volar tilt to my satisfaction without difficulty and there were no complicating features.  Screw lengths were checked, all looked excellent.  I was quite pleased with this in the findings.  Following this, we irrigated copiously, performed final copy AP lateral and oblique x-rays which were performed, examined, and interpreted by myself.  All angles in geometry looked excellent.  Distal radioulnar joint, midcarpal and radiocarpal joints looked excellent.  She was irrigated with greater than 2 L of saline and hemostasis was secured.  TLS drain placed.  Pronator closed with Vicryl and the skin edge closed with Prolene.  A gauntlet-type short-arm splint was placed after sterile dressing was applied.  She was monitored in the recovery care area, then will be discharged home today on Keflex and oxycodone.  We will see her back in the office in 12-14 days.  Elevate, move, massage the fingers.  I went over all these issues with her at length and we will remove the drain in the PACU before she goes home, nothing but a pleasure to see her today and participate in her surgical care.     Satira Anis. Amedeo Plenty, M.D.  WMG/MEDQ  D:  07/08/2015  T:  07/08/2015  Job:  [id number]

## 2015-07-08 NOTE — Progress Notes (Addendum)
Pt ambulated to the bathroom shortly after arriving on unit. She complained of feeling light headed and was quickly placed back in bed. While the nurse tech was performing the EKG she noticed the patient was unresponsive. RN called. Pt had slack mouth, was unresponsive and very pale and was incontinent of urine. Pt placed in trendelenburg position and within 30 seconds she started to respond. IV fluids started. Vital signs: 123/55, 66, 16. Placed on oxygen and continuous pulse ox. Pulse on EKG was 52 during episode. Pt was given food to eat and was feeling much better within 30 minutes of her syncopal episode.

## 2015-07-08 NOTE — Anesthesia Procedure Notes (Addendum)
Anesthesia Regional Block:  Axillary brachial plexus block  Pre-Anesthetic Checklist: ,, timeout performed, Correct Patient, Correct Site, Correct Laterality, Correct Procedure, Correct Position, site marked, Risks and benefits discussed, Surgical consent,  Pre-op evaluation,  At surgeon's request  Laterality: Left and Upper  Prep: chloraprep       Needles:  Injection technique: Single-shot  Needle Type: Echogenic Needle          Additional Needles:  Procedures: ultrasound guided (picture in chart) Axillary brachial plexus block Narrative:  Injection made incrementally with aspirations every 5 mL.  Performed by: Personally   Additional Notes: H+P and labs reviewed, risks and benefits discussed with patient, procedure tolerated well without complications   Procedure Name: MAC Date/Time: 07/08/2015 11:14 AM Performed by: Lavell Luster Pre-anesthesia Checklist: Patient identified, Emergency Drugs available, Suction available, Patient being monitored and Timeout performed Patient Re-evaluated:Patient Re-evaluated prior to inductionOxygen Delivery Method: Simple face mask Intubation Type: IV induction Placement Confirmation: positive ETCO2 and breath sounds checked- equal and bilateral Dental Injury: Teeth and Oropharynx as per pre-operative assessment

## 2015-07-08 NOTE — Discharge Instructions (Signed)

## 2015-07-08 NOTE — Op Note (Signed)
See Lupita Shutter MD

## 2015-07-09 ENCOUNTER — Encounter (HOSPITAL_COMMUNITY): Payer: Self-pay | Admitting: Orthopedic Surgery

## 2015-10-24 ENCOUNTER — Other Ambulatory Visit: Payer: Self-pay | Admitting: Family

## 2015-10-24 ENCOUNTER — Other Ambulatory Visit: Payer: Self-pay | Admitting: Internal Medicine

## 2015-10-24 DIAGNOSIS — E2839 Other primary ovarian failure: Secondary | ICD-10-CM

## 2015-10-24 DIAGNOSIS — Z1231 Encounter for screening mammogram for malignant neoplasm of breast: Secondary | ICD-10-CM

## 2015-10-24 DIAGNOSIS — M8580 Other specified disorders of bone density and structure, unspecified site: Secondary | ICD-10-CM

## 2015-11-19 ENCOUNTER — Ambulatory Visit
Admission: RE | Admit: 2015-11-19 | Discharge: 2015-11-19 | Disposition: A | Discharge status: Home / Self Care | Payer: Federal, State, Local not specified - PPO | Source: Ambulatory Visit | Attending: Internal Medicine | Admitting: Internal Medicine

## 2015-11-19 ENCOUNTER — Ambulatory Visit
Admission: RE | Admit: 2015-11-19 | Discharge: 2015-11-19 | Disposition: A | Discharge status: Home / Self Care | Payer: Federal, State, Local not specified - PPO | Source: Ambulatory Visit | Attending: Family | Admitting: Family

## 2015-11-19 DIAGNOSIS — Z1231 Encounter for screening mammogram for malignant neoplasm of breast: Secondary | ICD-10-CM

## 2015-11-19 DIAGNOSIS — E2839 Other primary ovarian failure: Secondary | ICD-10-CM

## 2015-11-19 DIAGNOSIS — M8580 Other specified disorders of bone density and structure, unspecified site: Secondary | ICD-10-CM

## 2016-10-23 ENCOUNTER — Other Ambulatory Visit: Payer: Self-pay | Admitting: Family Medicine

## 2016-10-23 ENCOUNTER — Other Ambulatory Visit: Payer: Self-pay | Admitting: Internal Medicine

## 2016-10-23 DIAGNOSIS — Z1231 Encounter for screening mammogram for malignant neoplasm of breast: Secondary | ICD-10-CM

## 2016-11-19 ENCOUNTER — Ambulatory Visit
Admission: RE | Admit: 2016-11-19 | Discharge: 2016-11-19 | Disposition: A | Discharge status: Home / Self Care | Payer: Federal, State, Local not specified - PPO | Source: Ambulatory Visit | Attending: Family Medicine | Admitting: Family Medicine

## 2016-11-19 DIAGNOSIS — Z1231 Encounter for screening mammogram for malignant neoplasm of breast: Secondary | ICD-10-CM

## 2017-08-06 ENCOUNTER — Other Ambulatory Visit: Payer: Self-pay | Admitting: Family Medicine

## 2017-08-06 ENCOUNTER — Ambulatory Visit
Admission: RE | Admit: 2017-08-06 | Discharge: 2017-08-06 | Disposition: A | Discharge status: Home / Self Care | Payer: Federal, State, Local not specified - PPO | Source: Ambulatory Visit | Attending: Family Medicine | Admitting: Family Medicine

## 2017-08-06 DIAGNOSIS — R0781 Pleurodynia: Secondary | ICD-10-CM

## 2017-08-18 ENCOUNTER — Other Ambulatory Visit: Payer: Self-pay | Admitting: Family Medicine

## 2017-08-18 DIAGNOSIS — M810 Age-related osteoporosis without current pathological fracture: Secondary | ICD-10-CM

## 2017-08-18 DIAGNOSIS — Z139 Encounter for screening, unspecified: Secondary | ICD-10-CM

## 2017-10-31 ENCOUNTER — Observation Stay (HOSPITAL_COMMUNITY)
Admission: EM | Admit: 2017-10-31 | Discharge: 2017-11-01 | Disposition: A | Discharge status: Home / Self Care | Payer: Federal, State, Local not specified - PPO | Attending: Family Medicine | Admitting: Family Medicine

## 2017-10-31 ENCOUNTER — Encounter (HOSPITAL_COMMUNITY): Payer: Self-pay | Admitting: Pharmacy Technician

## 2017-10-31 ENCOUNTER — Emergency Department (HOSPITAL_COMMUNITY): Payer: Federal, State, Local not specified - PPO

## 2017-10-31 ENCOUNTER — Other Ambulatory Visit: Payer: Self-pay

## 2017-10-31 DIAGNOSIS — Z79899 Other long term (current) drug therapy: Secondary | ICD-10-CM | POA: Diagnosis not present

## 2017-10-31 DIAGNOSIS — I420 Dilated cardiomyopathy: Secondary | ICD-10-CM | POA: Insufficient documentation

## 2017-10-31 DIAGNOSIS — E861 Hypovolemia: Secondary | ICD-10-CM | POA: Diagnosis not present

## 2017-10-31 DIAGNOSIS — Z90710 Acquired absence of both cervix and uterus: Secondary | ICD-10-CM | POA: Insufficient documentation

## 2017-10-31 DIAGNOSIS — R55 Syncope and collapse: Secondary | ICD-10-CM | POA: Diagnosis present

## 2017-10-31 DIAGNOSIS — E871 Hypo-osmolality and hyponatremia: Secondary | ICD-10-CM | POA: Diagnosis present

## 2017-10-31 DIAGNOSIS — I081 Rheumatic disorders of both mitral and tricuspid valves: Secondary | ICD-10-CM | POA: Diagnosis not present

## 2017-10-31 DIAGNOSIS — Z9889 Other specified postprocedural states: Secondary | ICD-10-CM | POA: Diagnosis not present

## 2017-10-31 DIAGNOSIS — Z96651 Presence of right artificial knee joint: Secondary | ICD-10-CM | POA: Insufficient documentation

## 2017-10-31 HISTORY — DX: Syncope and collapse: R55

## 2017-10-31 HISTORY — DX: Hypo-osmolality and hyponatremia: E87.1

## 2017-10-31 LAB — CBC WITH DIFFERENTIAL/PLATELET
ABS IMMATURE GRANULOCYTES: 0 10*3/uL (ref 0.0–0.1)
BASOS ABS: 0.1 10*3/uL (ref 0.0–0.1)
BASOS PCT: 1 %
Eosinophils Absolute: 0.2 10*3/uL (ref 0.0–0.7)
Eosinophils Relative: 2 %
HCT: 42.9 % (ref 36.0–46.0)
HEMOGLOBIN: 14.3 g/dL (ref 12.0–15.0)
IMMATURE GRANULOCYTES: 0 %
LYMPHS ABS: 1.5 10*3/uL (ref 0.7–4.0)
Lymphocytes Relative: 16 %
MCH: 29.1 pg (ref 26.0–34.0)
MCHC: 33.3 g/dL (ref 30.0–36.0)
MCV: 87.4 fL (ref 78.0–100.0)
Monocytes Absolute: 0.7 10*3/uL (ref 0.1–1.0)
Monocytes Relative: 8 %
Neutro Abs: 6.7 10*3/uL (ref 1.7–7.7)
Neutrophils Relative %: 73 %
PLATELETS: 178 10*3/uL (ref 150–400)
RBC: 4.91 MIL/uL (ref 3.87–5.11)
RDW: 13.4 % (ref 11.5–15.5)
WBC: 9.2 10*3/uL (ref 4.0–10.5)

## 2017-10-31 LAB — COMPREHENSIVE METABOLIC PANEL
ALBUMIN: 3.9 g/dL (ref 3.5–5.0)
ALK PHOS: 70 U/L (ref 38–126)
ALT: 11 U/L — ABNORMAL LOW (ref 14–54)
ANION GAP: 12 (ref 5–15)
AST: 24 U/L (ref 15–41)
BILIRUBIN TOTAL: 0.9 mg/dL (ref 0.3–1.2)
BUN: 19 mg/dL (ref 6–20)
CALCIUM: 8.6 mg/dL — AB (ref 8.9–10.3)
CO2: 24 mmol/L (ref 22–32)
Chloride: 97 mmol/L — ABNORMAL LOW (ref 101–111)
Creatinine, Ser: 0.99 mg/dL (ref 0.44–1.00)
GFR calc non Af Amer: 52 mL/min — ABNORMAL LOW (ref >60)
GLUCOSE: 120 mg/dL — AB (ref 65–99)
POTASSIUM: 3.9 mmol/L (ref 3.5–5.1)
SODIUM: 133 mmol/L — AB (ref 135–145)
TOTAL PROTEIN: 6.4 g/dL — AB (ref 6.5–8.1)

## 2017-10-31 LAB — CBG MONITORING, ED: Glucose-Capillary: 116 mg/dL — ABNORMAL HIGH (ref 65–99)

## 2017-10-31 LAB — TROPONIN I: Troponin I: <0.03 ng/mL (ref <0.03)

## 2017-10-31 MED ORDER — SENNOSIDES-DOCUSATE SODIUM 8.6-50 MG PO TABS
1.0000 | ORAL_TABLET | Freq: Every evening | ORAL | Status: DC | PRN
Start: 2017-10-31 — End: 2017-11-01

## 2017-10-31 MED ORDER — SODIUM CHLORIDE 0.9 % IV SOLN
INTRAVENOUS | Status: AC
  Administered 2017-11-01 (×2): via INTRAVENOUS

## 2017-10-31 MED ORDER — VITAMIN B-12 1000 MCG PO TABS
1000.0000 ug | ORAL_TABLET | Freq: Every day | ORAL | Status: DC
  Administered 2017-11-01: 1000 ug via ORAL
  Filled 2017-10-31: qty 1

## 2017-10-31 MED ORDER — MORPHINE SULFATE (PF) 4 MG/ML IV SOLN: 3.0000 mg | INTRAVENOUS | Status: DC | PRN

## 2017-10-31 MED ORDER — ONDANSETRON HCL 4 MG PO TABS: 4.0000 mg | ORAL_TABLET | Freq: Four times a day (QID) | ORAL | Status: DC | PRN

## 2017-10-31 MED ORDER — SODIUM CHLORIDE 0.9% FLUSH
3.0000 mL | Freq: Two times a day (BID) | INTRAVENOUS | Status: DC
  Administered 2017-11-01 (×2): 3 mL via INTRAVENOUS

## 2017-10-31 MED ORDER — ACETAMINOPHEN 650 MG RE SUPP: 650.0000 mg | Freq: Four times a day (QID) | RECTAL | Status: DC | PRN

## 2017-10-31 MED ORDER — FAMOTIDINE 20 MG PO TABS
40.0000 mg | ORAL_TABLET | Freq: Every day | ORAL | Status: DC
  Administered 2017-11-01: 40 mg via ORAL
  Filled 2017-10-31: qty 2

## 2017-10-31 MED ORDER — HYDROCODONE-ACETAMINOPHEN 5-325 MG PO TABS: 1.0000 | ORAL_TABLET | ORAL | Status: DC | PRN

## 2017-10-31 MED ORDER — DULOXETINE HCL 30 MG PO CPEP
30.0000 mg | ORAL_CAPSULE | Freq: Every day | ORAL | Status: DC
  Administered 2017-11-01: 30 mg via ORAL
  Filled 2017-10-31: qty 1

## 2017-10-31 MED ORDER — BISACODYL 5 MG PO TBEC: 5.0000 mg | DELAYED_RELEASE_TABLET | Freq: Every day | ORAL | Status: DC | PRN

## 2017-10-31 MED ORDER — ONDANSETRON HCL 4 MG/2ML IJ SOLN: 4.0000 mg | Freq: Four times a day (QID) | INTRAMUSCULAR | Status: DC | PRN

## 2017-10-31 MED ORDER — SODIUM CHLORIDE 0.9 % IV SOLN
INTRAVENOUS | Status: DC
  Administered 2017-10-31: via INTRAVENOUS

## 2017-10-31 MED ORDER — ACETAMINOPHEN 325 MG PO TABS: 650.0000 mg | ORAL_TABLET | Freq: Four times a day (QID) | ORAL | Status: DC | PRN

## 2017-10-31 NOTE — ED Provider Notes (Signed)
[facility] EMERGENCY DEPARTMENT Provider Note   CSN: [id number] Arrival date & time: 10/31/17  1922     History   Chief Complaint Chief Complaint  Patient presents with  . Loss of Consciousness    HPI [name] is a 83 y.o. female.  HPI Patient presents after a fall in a separate incident of syncope. She states that she is generally well, was well until a mechanical fall that occurred about 6 hours ago. She recalls tripping, falling striking her chin against the ground, sustaining an abrasion. She did not lose consciousness, and since that time has had no jaw pain, no broken teeth, no lightheadedness, no chest pain or other complaints per About 4 hours later the patient was with a friend, when she was noticed to lose consciousness for about 5 minutes. No new traumatic effects, and no prolonged recovery.  Consistent with a postictal phase. Upon awakening she denied chest pain, and denies any pain currently. She states that she feels slightly weak in general, but has no focal pain, lightheadedness, nausea currently.  History reviewed. No pertinent past medical history.  Patient Active Problem List   Diagnosis Date Noted  . Displaced fracture of distal end of left radius 07/07/2015    Past Surgical History:  Procedure Laterality Date  . ABDOMINAL HYSTERECTOMY    . BREAST EXCISIONAL BIOPSY Left 2004  . ORIF WRIST FRACTURE Left 07/08/2015   Procedure: OPEN REDUCTION INTERNAL FIXATION (ORIF) WRIST FRACTURE;  Surgeon: Roseanne Kaufman, MD;  Location: [facility];  Service: Orthopedics;  Laterality: Left;  . REPLACEMENT TOTAL KNEE Right      OB History   None      Home Medications    Prior to Admission medications   Medication Sig Start Date End Date Taking? Authorizing Provider  acetaminophen (TYLENOL) 500 MG tablet Take 500 mg by mouth every 6 (six) hours as needed for mild pain or headache.   Yes [provider]  CALCIUM-VITAMIN D PO Take 1  tablet by mouth daily.   Yes [provider]  Cholecalciferol (VITAMIN D PO) Take 1 capsule by mouth daily.   Yes [provider]  DULoxetine (CYMBALTA) 30 MG capsule Take 30 mg by mouth daily. 06/22/15  Yes [provider]  Fish Oil-Cholecalciferol (FISH OIL + D3 PO) Take 1 capsule by mouth daily.   Yes [provider]  PRESCRIPTION MEDICATION as needed. 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   Yes [provider]  ranitidine (ZANTAC) 300 MG tablet Take 300 mg by mouth at bedtime. 10/09/17  Yes [provider]  vitamin B-12 (CYANOCOBALAMIN) 250 MCG tablet Take 250 mcg by mouth daily.   Yes [provider]  VITAMIN E PO Take 1 capsule by mouth daily.   Yes [provider]    Family History Family History  Problem Relation Age of Onset  . Breast cancer Paternal Aunt     Social History Social History   Tobacco Use  . Smoking status: Never Smoker  Substance Use Topics  . Alcohol use: Yes    Comment: seldom  . Drug use: No     Allergies   Patient has no known allergies.   Review of Systems Review of Systems  Constitutional:       Per HPI, otherwise negative  HENT:       Per HPI, otherwise negative  Respiratory:  Per HPI, otherwise negative  Cardiovascular:       Per HPI, otherwise negative  Gastrointestinal: Negative for vomiting.  Endocrine:       Negative aside from HPI  Genitourinary:       Neg aside from HPI   Musculoskeletal:       Per HPI, otherwise negative  Skin: Positive for wound.  Neurological: Positive for syncope and weakness.     Physical Exam Updated Vital Signs BP (!) 143/83   Pulse 72   Temp 97.7 F (36.5 C) (Oral)   Resp 13   Ht 5\' 6"  (1.676 m)   Wt 62.6 kg (138 lb)   SpO2 96%   BMI 22.27 kg/m   Physical Exam  Constitutional: She is oriented to person, place, and time. She appears well-developed  and well-nourished. No distress.  HENT:  Head: Normocephalic.    Eyes: Conjunctivae and EOM are normal.  Neck: Full passive range of motion without pain. Neck supple. No spinous process tenderness and no muscular tenderness present. No neck rigidity. No edema, no erythema and normal range of motion present.  Cardiovascular: Normal rate and regular rhythm.  Pulmonary/Chest: Effort normal and breath sounds normal. No stridor. No respiratory distress.  Abdominal: She exhibits no distension.  Musculoskeletal: She exhibits no edema.  Neurological: She is alert and oriented to person, place, and time. She displays no atrophy and no tremor. No cranial nerve deficit. She exhibits normal muscle tone. She displays no seizure activity. Coordination normal.  Skin: Skin is warm and dry.  Psychiatric: She has a normal mood and affect.  Nursing note and vitals reviewed.    ED Treatments / Results  Labs (all labs ordered are listed, but only abnormal results are displayed) Labs Reviewed  COMPREHENSIVE METABOLIC PANEL - Abnormal; Notable for the following components:      Result Value   Sodium 133 (*)    Chloride 97 (*)    Glucose, Bld 120 (*)    Calcium 8.6 (*)    Total Protein 6.4 (*)    ALT 11 (*)    GFR calc non Af Amer 52 (*)    All other components within normal limits  CBG MONITORING, ED - Abnormal; Notable for the following components:   Glucose-Capillary 116 (*)    All other components within normal limits  TROPONIN I  CBC WITH DIFFERENTIAL/PLATELET  CBG MONITORING, ED    EKG EKG Interpretation  Date/Time:  Saturday October 31 2017 19:41:13 EDT Ventricular Rate:  60 PR Interval:    QRS Duration: 87 QT Interval:  489 QTC Calculation: 489 R Axis:   89 Text Interpretation:  Sinus rhythm Biatrial enlargement Borderline right axis deviation Borderline prolonged QT interval Abnormal ekg Confirmed by Carmin Muskrat [phone]) on 10/31/2017 7:55:35 PM   Radiology Dg Chest 2  View  Result Date: 10/31/2017 CLINICAL DATA:  Syncope. EXAM: CHEST - 2 VIEW COMPARISON:  Chest and rib radiographs 08/06/2017 FINDINGS: Heart is enlarged. There is no edema or effusion. Pectus excavatum is noted. Degenerative changes of the thoracic spine are stable. The visualized soft tissues and bony thorax are otherwise unremarkable. IMPRESSION: 1. Mild cardiomegaly without failure. 2. No acute cardiopulmonary disease. Electronically Signed   By: San Morelle M.D.   On: 10/31/2017 21:03    Procedures Procedures (including critical care time)  Medications Ordered in ED Medications  0.9 %  sodium chloride infusion (has no administration in time range)     Initial Impression / Assessment and Plan /  ED Course  I have reviewed the triage vital signs and the nursing notes.  Pertinent labs & imaging results that were available during my care of the patient were reviewed by me and considered in my medical decision making (see chart for details).     11:28 PM On repeat exam the patient is in similar condition, describe generalized weakness, but without focality, and without new pain. We discussed all findings at length, and given the unclear circumstances of her episode of syncope, though with no current evidence for ongoing coronary ischemia, no monitor evidence for sustained arrhythmia, no lab evidence for infection, she was admitted for further evaluation and management.  Final Clinical Impressions(s) / ED Diagnoses  Syncope Fall, initial encounter Jens Som   Carmin Muskrat, MD 10/31/17 2328

## 2017-10-31 NOTE — ED Triage Notes (Signed)
Pt arrives via EMS with reports of Syncope for approx 5 minutes. Pt had a ground level fall at approx 1200 today with small lac to chin. Pt friend noticed the lac was still oozing blood a few hours later and went to go get the car to take pt to UC. Friend was gone approx 5 minutes and came back to pt being unresponsive. Pt was still unresponsive upon fire dept arrival on scene. Pt hypotensive with EMS initally at 84/55. Pt given 300CC NS en route. EKG showed SB with HR in the 50's. All other VSS with EMS. Pt denies blood thinners.

## 2017-10-31 NOTE — H&P (Signed)
History and Physical    [name] YSA:[id number] DOB: 1934/08/15 DOA: 10/31/2017  PCP: Antony Blackbird, MD (Inactive)   Patient coming from: Home  Chief Complaint: Syncope   HPI: Ashley Solomon is a 83 y.o. female who generally enjoys good health, now presenting to the emergency department for evaluation of a syncopal episode.  Patient reports that she had been in her usual state of health and was having an uneventful day when she tripped over something causing her to fall and bump her chin.  She denies hitting her head or losing consciousness at that time, but had difficulty stopping the bleeding from her chin and asked her neighbor to take her to an urgent care.  The neighbor went next door to get her car, returned within 5 minutes, and found the patient to be unconscious while sitting in her recliner.  Patient reports that she had gone to the refrigerator for a bottle of Ensure, and then remembers waking up in her recliner.  She denies any chest pain or palpitations, denies any nausea or vomiting, and denies any red similar episode previously.  There was no shaking, incontinence, or tongue bite during the episode.  Patient reports being back to her usual condition at time of arrival in the ED.  EMS was called and the patient was found to have a blood pressure of 84/55.  She was treated with 300 cc of normal saline prior to arrival in the ED.  ED Course: Upon arrival to the ED, patient is found to be afebrile, saturating well on room air, and with vitals otherwise normal.  EKG features a sinus rhythm with QTc interval of 489 ms.  Chest x-ray is negative for acute cardiopulmonary disease.  Chemistry panel features a mild hyponatremia.  CBC is unremarkable and troponin is undetectable.  Patient was treated with IV fluids in the ED, remains hemodynamically stable, and will be observed on the telemetry unit for ongoing evaluation and management of syncope.  Review of Systems:  All other systems  reviewed and apart from HPI, are negative.  History reviewed. No pertinent past medical history.  Past Surgical History:  Procedure Laterality Date  . ABDOMINAL HYSTERECTOMY    . BREAST EXCISIONAL BIOPSY Left 2004  . ORIF WRIST FRACTURE Left 07/08/2015   Procedure: OPEN REDUCTION INTERNAL FIXATION (ORIF) WRIST FRACTURE;  Surgeon: Roseanne Kaufman, MD;  Location: [facility];  Service: Orthopedics;  Laterality: Left;  . REPLACEMENT TOTAL KNEE Right      reports that she has never smoked. She does not have any smokeless tobacco history on file. She reports that she drinks alcohol. She reports that she does not use drugs.  No Known Allergies  Family History  Problem Relation Age of Onset  . Breast cancer Paternal Aunt      Prior to Admission medications   Medication Sig Start Date End Date Taking? Authorizing Provider  acetaminophen (TYLENOL) 500 MG tablet Take 500 mg by mouth every 6 (six) hours as needed for mild pain or headache.   Yes [provider]  CALCIUM-VITAMIN D PO Take 1 tablet by mouth daily.   Yes [provider]  Cholecalciferol (VITAMIN D PO) Take 1 capsule by mouth daily.   Yes [provider]  DULoxetine (CYMBALTA) 30 MG capsule Take 30 mg by mouth daily. 06/22/15  Yes [provider]  Fish Oil-Cholecalciferol (FISH OIL + D3 PO) Take 1 capsule by mouth daily.   Yes [provider]  PRESCRIPTION MEDICATION as needed. Pt reports  that she has a prosthetic right knee and must have an antibiotic when she has an open wound.   Yes [provider]  ranitidine (ZANTAC) 300 MG tablet Take 300 mg by mouth at bedtime. 10/09/17  Yes [provider]  vitamin B-12 (CYANOCOBALAMIN) 250 MCG tablet Take 250 mcg by mouth daily.   Yes [provider]  VITAMIN E PO Take 1 capsule by mouth daily.   Yes [provider]    Physical Exam: Vitals:   10/31/17 2215 10/31/17 2245 10/31/17 2300 10/31/17 2315  BP: 137/90 (!)  143/82 (!) 143/83 133/77  Pulse: 70 70 72 72  Resp: 15 12 13 15   Temp:      TempSrc:      SpO2: 97% 95% 96% 96%  Weight:      Height:          Constitutional: NAD, calm Eyes: PERTLA, lids and conjunctivae normal ENMT: Mucous membranes are moist. Posterior pharynx clear of any exudate or lesions.   Neck: normal, supple, no masses, no thyromegaly Respiratory: clear to auscultation bilaterally, no wheezing, no crackles. Normal respiratory effort.    Cardiovascular: S1 & S2 heard, regular rate and rhythm. No extremity edema. No significant JVD. Abdomen: No distension, no tenderness, soft. Bowel sounds normal.  Musculoskeletal: no clubbing / cyanosis. No joint deformity upper and lower extremities.    Skin: Ecchymosis and superficial laceration to mentum. Warm, dry, well-perfused. Neurologic: CN 2-12 grossly intact. Sensation intact, DTR normal. Strength 5/5 in all 4 limbs.  Psychiatric: Alert and oriented x 3. Pleasant and cooperative.     Labs on Admission: I have personally reviewed following labs and imaging studies  CBC: Recent Labs  Lab 10/31/17 2011  WBC 9.2  NEUTROABS 6.7  HGB 14.3  HCT 42.9  MCV 87.4  PLT 355   Basic Metabolic Panel: Recent Labs  Lab 10/31/17 2011  NA 133*  K 3.9  CL 97*  CO2 24  GLUCOSE 120*  BUN 19  CREATININE 0.99  CALCIUM 8.6*   GFR: Estimated Creatinine Clearance: 42.4 mL/min (by C-G formula based on SCr of 0.99 mg/dL). Liver Function Tests: Recent Labs  Lab 10/31/17 2011  AST 24  ALT 11*  ALKPHOS 70  BILITOT 0.9  PROT 6.4*  ALBUMIN 3.9   No results for input(s): LIPASE, AMYLASE in the last 168 hours. No results for input(s): AMMONIA in the last 168 hours. Coagulation Profile: No results for input(s): INR, PROTIME in the last 168 hours. Cardiac Enzymes: Recent Labs  Lab 10/31/17 2011  TROPONINI <0.03   BNP (last 3 results) No results for input(s): PROBNP in the last 8760 hours. HbA1C: No results for input(s):  HGBA1C in the last 72 hours. CBG: Recent Labs  Lab 10/31/17 1957  GLUCAP 116*   Lipid Profile: No results for input(s): CHOL, HDL, LDLCALC, TRIG, CHOLHDL, LDLDIRECT in the last 72 hours. Thyroid Function Tests: No results for input(s): TSH, T4TOTAL, FREET4, T3FREE, THYROIDAB in the last 72 hours. Anemia Panel: No results for input(s): VITAMINB12, FOLATE, FERRITIN, TIBC, IRON, RETICCTPCT in the last 72 hours. Urine analysis: No results found for: COLORURINE, APPEARANCEUR, LABSPEC, PHURINE, GLUCOSEU, HGBUR, BILIRUBINUR, KETONESUR, PROTEINUR, UROBILINOGEN, NITRITE, LEUKOCYTESUR Sepsis Labs: @LABRCNTIP (procalcitonin:4,lacticidven:4) )No results found for this or any previous visit (from the past 240 hour(s)).   Radiological Exams on Admission: Dg Chest 2 View  Result Date: 10/31/2017 CLINICAL DATA:  Syncope. EXAM: CHEST - 2 VIEW COMPARISON:  Chest and rib radiographs 08/06/2017 FINDINGS: Heart is enlarged. There  is no edema or effusion. Pectus excavatum is noted. Degenerative changes of the thoracic spine are stable. The visualized soft tissues and bony thorax are otherwise unremarkable. IMPRESSION: 1. Mild cardiomegaly without failure. 2. No acute cardiopulmonary disease. Electronically Signed   By: San Morelle M.D.   On: 10/31/2017 21:03    EKG: Independently reviewed. Sinus rhythm, QTc 489 ms.  Assessment/Plan  1. Syncope  - Presents following a syncopal episode that occurred while seated and without prodrome  - She had tripped earlier in the day, resulting in a superficial laceration to her chin that was bleeding and sore; suspect a vasovagal etiology in this setting but given pt age and lack of prodrome, will observe in hospital for further eval  - Continue cardiac monitoring, check orthostatic vitals, check echo    2. Hyponatremia  - Serum sodium is 133 on admission; pt appears hypovolemic  - Continue IVF hydration and repeat chem panel in am    DVT prophylaxis: SCD's    Code Status: Full  Family Communication: Discussed with patient Consults called: None Admission status: Observation    Vianne Bulls, MD [facility] Pager [phone]  If 7PM-7AM, please contact night-coverage [email or web address] Password [email or web address]  10/31/2017, 11:40 PM

## 2017-10-31 NOTE — ED Notes (Signed)
Admitting MD at bedside.

## 2017-11-01 ENCOUNTER — Other Ambulatory Visit (HOSPITAL_COMMUNITY): Payer: Federal, State, Local not specified - PPO

## 2017-11-01 ENCOUNTER — Other Ambulatory Visit: Payer: Self-pay

## 2017-11-01 ENCOUNTER — Observation Stay (HOSPITAL_BASED_OUTPATIENT_CLINIC_OR_DEPARTMENT_OTHER): Payer: Federal, State, Local not specified - PPO

## 2017-11-01 DIAGNOSIS — I081 Rheumatic disorders of both mitral and tricuspid valves: Secondary | ICD-10-CM | POA: Diagnosis not present

## 2017-11-01 DIAGNOSIS — I420 Dilated cardiomyopathy: Secondary | ICD-10-CM | POA: Diagnosis not present

## 2017-11-01 DIAGNOSIS — R55 Syncope and collapse: Secondary | ICD-10-CM

## 2017-11-01 DIAGNOSIS — I361 Nonrheumatic tricuspid (valve) insufficiency: Secondary | ICD-10-CM

## 2017-11-01 DIAGNOSIS — E861 Hypovolemia: Secondary | ICD-10-CM | POA: Diagnosis not present

## 2017-11-01 DIAGNOSIS — E871 Hypo-osmolality and hyponatremia: Secondary | ICD-10-CM | POA: Diagnosis not present

## 2017-11-01 LAB — CBC
HCT: 41.1 % (ref 36.0–46.0)
Hemoglobin: 13.6 g/dL (ref 12.0–15.0)
MCH: 29.1 pg (ref 26.0–34.0)
MCHC: 33.1 g/dL (ref 30.0–36.0)
MCV: 87.8 fL (ref 78.0–100.0)
PLATELETS: 182 10*3/uL (ref 150–400)
RBC: 4.68 MIL/uL (ref 3.87–5.11)
RDW: 13.8 % (ref 11.5–15.5)
WBC: 6.1 10*3/uL (ref 4.0–10.5)

## 2017-11-01 LAB — BASIC METABOLIC PANEL
Anion gap: 7 (ref 5–15)
BUN: 14 mg/dL (ref 6–20)
CALCIUM: 8.3 mg/dL — AB (ref 8.9–10.3)
CO2: 26 mmol/L (ref 22–32)
Chloride: 104 mmol/L (ref 101–111)
Creatinine, Ser: 0.89 mg/dL (ref 0.44–1.00)
GFR, EST NON AFRICAN AMERICAN: 60 mL/min — AB (ref >60)
Glucose, Bld: 101 mg/dL — ABNORMAL HIGH (ref 65–99)
Potassium: 3.8 mmol/L (ref 3.5–5.1)
SODIUM: 137 mmol/L (ref 135–145)

## 2017-11-01 LAB — ECHOCARDIOGRAM COMPLETE
HEIGHTINCHES: 66 in
WEIGHTICAEL: 2216 [oz_av]

## 2017-11-01 LAB — GLUCOSE, CAPILLARY: GLUCOSE-CAPILLARY: 105 mg/dL — AB (ref 65–99)

## 2017-11-01 NOTE — Progress Notes (Signed)
  Echocardiogram 2D Echocardiogram has been performed.  Ashley Solomon 11/01/2017, 2:35 PM

## 2017-11-01 NOTE — Plan of Care (Signed)
  Problem: Education: Goal: Knowledge of General Education information will improve Outcome: Progressing   Problem: Health Behavior/Discharge Planning: Goal: Ability to manage health-related needs will improve Outcome: Progressing   Problem: Activity: Goal: Risk for activity intolerance will decrease Outcome: Progressing   Problem: Pain Managment: Goal: General experience of comfort will improve Outcome: Progressing   Problem: Safety: Goal: Ability to remain free from injury will improve Outcome: Progressing   

## 2017-11-01 NOTE — Progress Notes (Signed)
Pt got discharged to home, discharge instructions provided and patient showed understanding to it, IV taken out,Telemonitor DC,pt left unit in wheelchair with all of the belongings accompanied with a Friend, Nancy Marus, RN

## 2017-11-01 NOTE — Progress Notes (Signed)
Patient arrived to unit, no complaints of pain. RN changed dressing to chin and right hand. Patient ambulates with standby assist. Safety measures in place- Low bed.

## 2017-11-01 NOTE — Discharge Summary (Signed)
Physician Discharge Summary  Magdalen Spatz WCH:[id number] DOB: 12-22-1934 DOA: 10/31/2017  PCP: [name], MD  Admit date: 10/31/2017 Discharge date: 11/01/2017  Time spent: 35 minutes  Recommendations for Outpatient Follow-up:  1. Follow up outpatient CBC/CMP 2. Ensure patient gets outpatient event monitor with cardiology 3. Follow up elevated pulmonary artery pressure seen on echo as outpatient   Discharge Diagnoses:  Principal Problem:   Syncope, vasovagal Active Problems:   Hyponatremia   Discharge Condition: stable  Diet recommendation: heart healthy  Filed Weights   10/31/17 1932 11/01/17 0034 11/01/17 0416  Weight: 62.6 kg (138 lb) 63.4 kg (139 lb 12.8 oz) 62.8 kg (138 lb 8 oz)    History of present illness:  Per HPI [name] is Ashley Solomon 83 y.o. female who generally enjoys good health, now presenting to the emergency department for evaluation of Sheriff Rodenberg syncopal episode.  Patient reports that she had been in her usual state of health and was having an uneventful day when she tripped over something causing her to fall and bump her chin.  She denies hitting her head or losing consciousness at that time, but had difficulty stopping the bleeding from her chin and asked her neighbor to take her to an urgent care.  The neighbor went next door to get her car, returned within 5 minutes, and found the patient to be unconscious while sitting in her recliner.  Patient reports that she had gone to the refrigerator for Jemmie Rhinehart bottle of Ensure, and then remembers waking up in her recliner.  She denies any chest pain or palpitations, denies any nausea or vomiting, and denies any red similar episode previously.  There was no shaking, incontinence, or tongue bite during the episode.  Patient reports being back to her usual condition at time of arrival in the ED.  EMS was called and the patient was found to have Lugenia Assefa blood pressure of 84/55.  She was treated with 300 cc of normal saline prior to arrival in  the ED.  She was feeling better on hospital day 1.  She had negative orthostatics.  EKG notable for borderline prolonged QTc which improved on repeat EKG.  Her echo was notable for elevated PA pressure, but otherwise with normal EF (see report).  Given her lack of prodrome, planned for outpatient cardiac monitoring to look for arrhythmia.   See below for additional details  Hospital Course:  1. Syncope  - Presents following Levi Klaiber syncopal episode that occurred while seated and without prodrome  - She had tripped earlier in the day, resulting in Draven Laine superficial laceration to her chin that was bleeding and sore - ?vasovagal etiology in this setting, but without prodrome, pt admitted for further evaluation - Continue cardiac monitoring (sinus on tele), check orthostatic vitals (negative), check echo (EF 60-65%, elevated PA pressure - see report)  2. Hyponatremia  - improved on repeat after hydration  Procedures: Study Conclusions  - Left ventricle: The cavity size was normal. Wall thickness was   normal. Systolic function was normal. The estimated ejection   fraction was in the range of 60% to 65%. - Mitral valve: There was mild regurgitation. - Right atrium: The atrium was mildly dilated. - Tricuspid valve: There was moderate regurgitation. - Pulmonary arteries: PA peak pressure: 37 mm Hg (S).  Consultations: none  Discharge Exam: Vitals:   11/01/17 1238 11/01/17 1300  BP: (!) 162/88 140/80  Pulse: 66 68  Resp: 20 20  Temp: 98.2 F (36.8 C) 98.4 F (36.9 C)  SpO2: 100% 100%   Feels well.  Asking when she can go.  No prodrome.  Doesn't remember what happened before episode of syncope.  General: No acute distress. HEENT: superficial laceration to chin, healing well Cardiovascular: Heart sounds show Yasheka Fossett regular rate, and rhythm. No gallops or rubs. No murmurs. No JVD. Lungs: Clear to auscultation bilaterally with good air movement. No rales, rhonchi or wheezes. Abdomen: Soft,  nontender, nondistended with normal active bowel sounds. No masses. No hepatosplenomegaly. Neurological: Alert and oriented 3. Moves all extremities 4. Cranial nerves II through XII grossly intact. Skin: Warm and dry. No rashes or lesions. Extremities: No clubbing or cyanosis. No edema.  Psychiatric: Mood and affect are normal. Insight and judgment are appropriate.  Discharge Instructions   Discharge Instructions    Call MD for:  difficulty breathing, headache or visual disturbances   Complete by:  As directed    Call MD for:  extreme fatigue   Complete by:  As directed    Call MD for:  persistant dizziness or light-headedness   Complete by:  As directed    Call MD for:  persistant nausea and vomiting   Complete by:  As directed    Call MD for:  redness, tenderness, or signs of infection (pain, swelling, redness, odor or green/yellow discharge around incision site)   Complete by:  As directed    Call MD for:  severe uncontrolled pain   Complete by:  As directed    Call MD for:  temperature >100.4   Complete by:  As directed    Diet - low sodium heart healthy   Complete by:  As directed    Discharge instructions   Complete by:  As directed    You were seen for an episode of syncope.  Your work up did not reveal an obvious cause.  We will arrange follow up with cardiology to set you up for an event monitor (if you don't hear from them, please give them Creasie Lacosse call).  You should not drive until cleared by your doctor.  Return for new, recurrent, or worsening symptoms.  Please ask your PCP to request records from this hospitalization so they know what was done and what the next steps will be.   Increase activity slowly   Complete by:  As directed      Allergies as of 11/01/2017   No Known Allergies     Medication List    TAKE these medications   acetaminophen 500 MG tablet Commonly known as:  TYLENOL Take 500 mg by mouth every 6 (six) hours as needed for mild pain or  headache.   CALCIUM-VITAMIN D PO Take 1 tablet by mouth daily.   DULoxetine 30 MG capsule Commonly known as:  CYMBALTA Take 30 mg by mouth daily.   FISH OIL + D3 PO Take 1 capsule by mouth daily.   PRESCRIPTION MEDICATION as needed. Pt reports that she has Madgeline Rayo prosthetic right knee and must have an antibiotic when she has an open wound.   ranitidine 300 MG tablet Commonly known as:  ZANTAC Take 300 mg by mouth at bedtime.   vitamin B-12 250 MCG tablet Commonly known as:  CYANOCOBALAMIN Take 250 mcg by mouth daily.   VITAMIN D PO Take 1 capsule by mouth daily.   VITAMIN E PO Take 1 capsule by mouth daily.      No Known Allergies Follow-up Information    Fulp, Cammie, MD Follow up.   Specialty:  Family Medicine  Contact information: [address] [address] [phone]        [facility] Follow up.   Why:  If you don't hear from cardiology about your cardiac monitor within about 1 week, please call.   Contact information: [address], [address] [place] [phone]           The results of significant diagnostics from this hospitalization (including imaging, microbiology, ancillary and laboratory) are listed below for reference.    Significant Diagnostic Studies: Dg Chest 2 View  Result Date: 10/31/2017 CLINICAL DATA:  Syncope. EXAM: CHEST - 2 VIEW COMPARISON:  Chest and rib radiographs 08/06/2017 FINDINGS: Heart is enlarged. There is no edema or effusion. Pectus excavatum is noted. Degenerative changes of the thoracic spine are stable. The visualized soft tissues and bony thorax are otherwise unremarkable. IMPRESSION: 1. Mild cardiomegaly without failure. 2. No acute cardiopulmonary disease. Electronically Signed   By: San Morelle M.D.   On: 10/31/2017 21:03    Microbiology: No results found for this or any previous visit (from the past 240 hour(s)).   Labs: Basic Metabolic  Panel: Recent Labs  Lab 10/31/17 2011 11/01/17 0728  NA 133* 137  K 3.9 3.8  CL 97* 104  CO2 24 26  GLUCOSE 120* 101*  BUN 19 14  CREATININE 0.99 0.89  CALCIUM 8.6* 8.3*   Liver Function Tests: Recent Labs  Lab 10/31/17 2011  AST 24  ALT 11*  ALKPHOS 70  BILITOT 0.9  PROT 6.4*  ALBUMIN 3.9   No results for input(s): LIPASE, AMYLASE in the last 168 hours. No results for input(s): AMMONIA in the last 168 hours. CBC: Recent Labs  Lab 10/31/17 2011 11/01/17 0728  WBC 9.2 6.1  NEUTROABS 6.7  --   HGB 14.3 13.6  HCT 42.9 41.1  MCV 87.4 87.8  PLT 178 182   Cardiac Enzymes: Recent Labs  Lab 10/31/17 2011  TROPONINI <0.03   BNP: BNP (last 3 results) No results for input(s): BNP in the last 8760 hours.  ProBNP (last 3 results) No results for input(s): PROBNP in the last 8760 hours.  CBG: Recent Labs  Lab 10/31/17 1957 11/01/17 0728  GLUCAP 116* 105*       Signed:  Fayrene Helper MD.  [facility] 11/01/2017, 8:02 PM

## 2017-11-02 ENCOUNTER — Encounter: Payer: Self-pay | Admitting: Physician Assistant

## 2017-11-02 ENCOUNTER — Other Ambulatory Visit: Payer: Self-pay | Admitting: Physician Assistant

## 2017-11-02 DIAGNOSIS — R55 Syncope and collapse: Secondary | ICD-10-CM

## 2017-11-02 NOTE — Progress Notes (Signed)
Event monitor  Received: Today  Message Contents  Francis Doenges N, PA-C  P Cv Div Ch St Scheduling        Hi scheduling, I am covering the Trish inbox today. Internal medicine sent [place] this - patient was recently admitted with syncope, needs 30 day event monitor. Pt has been discharged. Not ever seen by our practice. I put the order in. Please call patient and schedule. Thanks!   Previous Messages    ----- Message -----  From: Elodia Florence., MD  Sent: 11/01/2017  8:17 PM  To: Candis Schatz   This patient needs a cardiac event monitor, I was hoping you all could arrange this for her.   Thanks,  [employer]

## 2017-11-02 NOTE — Progress Notes (Signed)
Requested by IM.

## 2017-11-05 ENCOUNTER — Ambulatory Visit (INDEPENDENT_AMBULATORY_CARE_PROVIDER_SITE_OTHER): Payer: Federal, State, Local not specified - PPO

## 2017-11-05 DIAGNOSIS — R55 Syncope and collapse: Secondary | ICD-10-CM | POA: Diagnosis not present

## 2017-11-20 ENCOUNTER — Ambulatory Visit
Admission: RE | Admit: 2017-11-20 | Discharge: 2017-11-20 | Disposition: A | Discharge status: Home / Self Care | Payer: Federal, State, Local not specified - PPO | Source: Ambulatory Visit | Attending: Family Medicine | Admitting: Family Medicine

## 2017-11-20 DIAGNOSIS — Z139 Encounter for screening, unspecified: Secondary | ICD-10-CM

## 2017-11-20 DIAGNOSIS — M810 Age-related osteoporosis without current pathological fracture: Secondary | ICD-10-CM

## 2018-01-05 ENCOUNTER — Ambulatory Visit: Payer: Federal, State, Local not specified - PPO | Admitting: Cardiology

## 2018-02-12 ENCOUNTER — Other Ambulatory Visit: Payer: Self-pay

## 2018-02-12 ENCOUNTER — Ambulatory Visit: Payer: Federal, State, Local not specified - PPO | Attending: Family Medicine | Admitting: Physical Therapy

## 2018-02-12 ENCOUNTER — Encounter: Payer: Self-pay | Admitting: Physical Therapy

## 2018-02-12 DIAGNOSIS — M25512 Pain in left shoulder: Secondary | ICD-10-CM

## 2018-02-12 DIAGNOSIS — M542 Cervicalgia: Secondary | ICD-10-CM | POA: Diagnosis present

## 2018-02-12 DIAGNOSIS — R252 Cramp and spasm: Secondary | ICD-10-CM | POA: Insufficient documentation

## 2018-02-12 DIAGNOSIS — G8929 Other chronic pain: Secondary | ICD-10-CM | POA: Insufficient documentation

## 2018-02-12 NOTE — Therapy (Signed)
[facility] [address], [place], [place], [address] Phone: [phone]   Fax:  [phone]  Physical Therapy Evaluation  Patient Details  Name: Ashley Solomon MRN: [id number] Date of Birth: 28-Feb-1935 Referring Provider: [name], MD   Encounter Date: 02/12/2018  PT End of Session - 02/12/18 1353    Visit Number  1    Date for PT Re-Evaluation  05/07/18   Pt traveling until mid-October, will delay treatment   PT Start Time  1106    PT Stop Time  1200    PT Time Calculation (min)  54 min    Activity Tolerance  Patient tolerated treatment well    Behavior During Therapy  [facility] for tasks assessed/performed       Past Medical History:  Diagnosis Date  . Displaced fracture of distal end of left radius 07/07/2015  . Hyponatremia 10/31/2017  . Syncope 10/31/2017  . Syncope, vasovagal 10/31/2017    Past Surgical History:  Procedure Laterality Date  . ABDOMINAL HYSTERECTOMY    . BREAST EXCISIONAL BIOPSY Left 2004  . ORIF WRIST FRACTURE Left 07/08/2015   Procedure: OPEN REDUCTION INTERNAL FIXATION (ORIF) WRIST FRACTURE;  Surgeon: Roseanne Kaufman, MD;  Location: [place];  Service: Orthopedics;  Laterality: Left;  . REPLACEMENT TOTAL KNEE Right     There were no vitals filed for this visit.   Subjective Assessment - 02/12/18 1110    Subjective  Pt reports a history of stress induced headaches on Lt neck and posterior head which is now improved.  Pt reports Lt shoulder pain which is worse at night if she lays on Lt side.  When pain is at worst it travels down arm into proximal posterior forearm and up into Lt neck "cord."  Pt believes this started about 12 years ago following carrying camping equipment.    Diagnostic tests  no    Patient Stated Goals  keep symptoms contained, not allow it to get worse    Currently in Pain?  Yes    Pain Score  2     Pain Location  Shoulder    Pain Orientation  Left    Pain Descriptors /  Indicators  Aching    Pain Type  Chronic pain    Pain Radiating Towards  into forearm and up into neck    Pain Onset  More than a month ago    Pain Frequency  Intermittent    Aggravating Factors   weight bearing through shoulder, sleeping on Lt side    Pain Relieving Factors  take all weight out of shoulder, let it hang    Effect of Pain on Daily Activities  sleeping,          [facility] PT Assessment - 02/12/18 0001      Assessment   Medical Diagnosis  M25.512 (ICD-10-CM) - Pain in left shoulder    Referring Provider  [name], MD    Hand Dominance  Right    Prior Therapy  no      Balance Screen   Has the patient fallen in the past 6 months  No    Has the patient had a decrease in activity level because of a fear of falling?   No    Is the patient reluctant to leave their home because of a fear of falling?   No      Home Social worker  Private residence    Living Arrangements  Alone  Type of Home  Other(Comment)   town home/condo   Home Layout  One level      Prior Function   Level of [place]  Retired      Associate Professor   Overall Cognitive Status  Within Functional Limits for tasks assessed      Observation/Other Assessments   Focus on Therapeutic Outcomes (FOTO)   20      Posture/Postural Control   Posture/Postural Control  Postural limitations    Postural Limitations  Rounded Shoulders    Posture Comments  winging scapula bil Rt>Lt      Tone   Assessment Location  --      ROM / Strength   AROM / PROM / Strength  AROM;Strength      AROM   Overall AROM   Within functional limits for tasks performed    Overall AROM Comments  bil UEs    AROM Assessment Site  Cervical    Cervical Flexion  50    Cervical Extension  40   with reproduction of Lt arm pain   Cervical - Right Side Bend  15    Cervical - Left Side Bend  15   with reproduction of Lt arm pain   Cervical - Right Rotation  70    Cervical - Left Rotation   40   with reproduction of Lt arm pain     Strength   Overall Strength  Within functional limits for tasks performed    Overall Strength Comments  bil UEs and cervical cardinal planes      Flexibility   Soft Tissue Assessment /Muscle Length  --      Palpation   Spinal mobility  limited upglides C5-C7, limited thoracic PAs and downglides T1-T6    Palpation comment  spasm/tender Lt upper trap, supra/infraspinatus, post delt      Special Tests    Special Tests  Cervical    Cervical Tests  Spurling's      Spurling's   Findings  Positive    Side  Left      LUE Tone   LUE Tone  --                Objective measurements completed on examination: See above findings.              PT Education - 02/12/18 1339    Education Details  dry needling    Person(s) Educated  Patient    Methods  Explanation;Handout    Comprehension  Verbalized understanding       PT Short Term Goals - 02/12/18 1405      PT SHORT TERM GOAL #1   Title  Pt will be independent in initial HEP    Time  6    Period  Weeks   delayed goals due to Pt travel plans   Status  New    Target Date  03/26/18      PT SHORT TERM GOAL #2   Title  Pt will report 25% less sleep disturbances due to Lt shoulder pain.    Time  8    Period  Weeks   delayed goals due to Pt travel plans   Target Date  04/09/18      PT SHORT TERM GOAL #3   Title  Pt will verbalize and put into practice avoidance of cervical Lt sidebending/rotation/extension positions to reduce pain occurences by 50%.    Time  8  Period  Weeks    Status  New    Target Date  04/09/18        PT Long Term Goals - 02/12/18 1408      PT LONG TERM GOAL #1   Title  Pt will be independent in long-term HEP.    Time  12    Period  Weeks    Status  New    Target Date  05/07/18      PT LONG TERM GOAL #2   Title  Pt will report 50% less sleep disturbances due to Lt arm pain.    Time  12    Period  Weeks    Status  New    Target  Date  05/07/18      PT LONG TERM GOAL #3   Title  Pt will report pain reduction by 50% or more throughout daily tasks.    Time  12    Period  Weeks    Status  New    Target Date  05/07/18             Plan - 02/12/18 1354    Clinical Impression Statement  Pt is an independent recently widowed retiree who has a long history of intermittent left sided shoulder, neck and arm pain.  She has full ROM and strength in bilateral UEs but is limited in her cervical spine with reproduction of Lt arm and shoulder pain with cervical extension, Lt sidebending and Lt rotation, suggesting possible positional nerve compression.  While she is mostly independent with daily tasks, she has trouble sleeping on her Lt side due to pain reproduction and would like to learn what to do to keep her symptoms from spreading or worsening.  She has palpable trigger points in neck and posterior shoulder which also reproduce her pain.  Pt will benefit from skilled PT and Pt education to address these deficits and maintain her current independent status with less pain.    Clinical Presentation  Stable    Clinical Decision Making  Low    Rehab Potential  Excellent    PT Frequency  2x / week   Pt traveling for next several weeks which will delay treatment   PT Duration  12 weeks    PT Treatment/Interventions  ADLs/Self Care Home Management;Electrical Stimulation;Moist Heat;Traction;Functional mobility training;Therapeutic activities;Therapeutic exercise;Patient/family education;Manual techniques;Passive range of motion;Dry needling;Taping    PT Next Visit Plan  dry needling Lt upper traps, posterior shoulder, upper thoracic jt mobs, Pt education on avoiding left cervical closing patterns, postural re-ed, scapular stabilization    PT Home Exercise Plan  Access Code: [email or web address]    Consulted and Agree with Plan of Care  Patient       Patient will benefit from skilled therapeutic intervention in order to improve the following  deficits and impairments:  Decreased range of motion, Increased muscle spasms, Pain, Decreased mobility, Postural dysfunction, Improper body mechanics, Hypomobility  Visit Diagnosis: Chronic left shoulder pain - Plan: PT plan of care cert/re-cert  Cramp and spasm - Plan: PT plan of care cert/re-cert  Cervicalgia - Plan: PT plan of care cert/re-cert     Problem List Patient Active Problem List   Diagnosis Date Noted  . Syncope, vasovagal 10/31/2017  . Hyponatremia 10/31/2017    Baruch Merl, PT 02/12/18 2:17 PM    [facility] [address] [place], [place], [address] Phone: [phone]   Fax:  [phone]  Name: Ashley Solomon MRN:  [id number] Date of Birth: July 10, 1934

## 2018-02-12 NOTE — Patient Instructions (Signed)
Access Code: [email or web address]  URL: [email or web address]  Date: 02/12/2018  Prepared by: Baruch Merl   Patient Education  Trigger Point Dry Needling

## 2018-03-24 ENCOUNTER — Ambulatory Visit: Payer: Federal, State, Local not specified - PPO | Attending: Family Medicine | Admitting: Physical Therapy

## 2018-03-24 ENCOUNTER — Encounter: Payer: Self-pay | Admitting: Physical Therapy

## 2018-03-24 DIAGNOSIS — M542 Cervicalgia: Secondary | ICD-10-CM | POA: Diagnosis present

## 2018-03-24 DIAGNOSIS — M25512 Pain in left shoulder: Secondary | ICD-10-CM | POA: Insufficient documentation

## 2018-03-24 DIAGNOSIS — G8929 Other chronic pain: Secondary | ICD-10-CM | POA: Insufficient documentation

## 2018-03-24 NOTE — Therapy (Signed)
[facility] [address], [place], [place], [address] Phone: [phone]   Fax:  [phone]  Physical Therapy Treatment  Patient Details  Name: Ashley Solomon MRN: [id number] Date of Birth: 01-May-1935 Referring Provider (PT): [name], MD   Encounter Date: 03/24/2018  PT End of Session - 03/24/18 1359    Visit Number  2    Date for PT Re-Evaluation  04/09/18    Authorization Type  [employer] FEDERAL    PT Start Time  1400    PT Stop Time  1445    PT Time Calculation (min)  45 min    Activity Tolerance  Patient tolerated treatment well    Behavior During Therapy  [facility] for tasks assessed/performed       Past Medical History:  Diagnosis Date  . Displaced fracture of distal end of left radius 07/07/2015  . Hyponatremia 10/31/2017  . Syncope 10/31/2017  . Syncope, vasovagal 10/31/2017    Past Surgical History:  Procedure Laterality Date  . ABDOMINAL HYSTERECTOMY    . BREAST EXCISIONAL BIOPSY Left 2004  . ORIF WRIST FRACTURE Left 07/08/2015   Procedure: OPEN REDUCTION INTERNAL FIXATION (ORIF) WRIST FRACTURE;  Surgeon: Roseanne Kaufman, MD;  Location: [place];  Service: Orthopedics;  Laterality: Left;  . REPLACEMENT TOTAL KNEE Right     There were no vitals filed for this visit.  Subjective Assessment - 03/24/18 1359    Subjective  Pt has been traveling and symptoms have changed since she was last here.  She has had no occurences of Lt arm pain but continues to have Lt sided neck pain.  She wonders if her symptoms are stress related since she was not dealing with household responsibilities while traveling.  She is curious to know if they come back now that she is back home.    Patient Stated Goals  keep symptoms contained, not allow it to get worse    Currently in Pain?  Yes    Pain Score  6     Pain Location  Neck    Pain Orientation  Left    Pain Descriptors / Indicators  Aching    Pain Type  Chronic pain    Pain  Onset  More than a month ago    Pain Frequency  Intermittent                       [facility] Adult PT Treatment/Exercise - 03/24/18 0001      Exercises   Exercises  Neck      Neck Exercises: Seated   Neck Retraction  5 reps;3 secs    Neck Retraction Limitations  PT provided tactile cues    Cervical Rotation  10 reps;Both    Other Seated Exercise  upper trap, sidebending and levator stretches 30 sec holds each   Lt     Manual Therapy   Manual Therapy  Soft tissue mobilization;Joint mobilization;Passive ROM    Joint Mobilization  downglides Gr II/III/IV C7-T2 bil    Soft tissue mobilization  Lt upper trap, levator, cervical paraspinals    Passive ROM  cervical flexion and rotation             PT Education - 03/24/18 1444    Education Details  Access Code: [email or web address]    Person(s) Educated  Patient    Methods  Explanation;Demonstration;Verbal cues;Handout;Tactile cues    Comprehension  Verbalized understanding;Returned demonstration       PT Short Term  Goals - 03/24/18 1717      PT SHORT TERM GOAL #1   Title  Pt will be independent in initial HEP    Time  6    Period  Weeks    Status  On-going   Pt traveled and today was first treatment     PT SHORT TERM GOAL #2   Title  Pt will report 25% less sleep disturbances due to Lt shoulder pain.    Time  8    Period  Weeks    Status  Achieved      PT SHORT TERM GOAL #3   Title  Pt will verbalize and put into practice avoidance of cervical Lt sidebending/rotation/extension positions to reduce pain occurences by 50%.    Time  8    Period  Weeks    Status  Achieved        PT Long Term Goals - 02/12/18 1408      PT LONG TERM GOAL #1   Title  Pt will be independent in long-term HEP.    Time  12    Period  Weeks    Status  New    Target Date  05/07/18      PT LONG TERM GOAL #2   Title  Pt will report 50% less sleep disturbances due to Lt arm pain.    Time  12    Period  Weeks    Status  New     Target Date  05/07/18      PT LONG TERM GOAL #3   Title  Pt will report pain reduction by 50% or more throughout daily tasks.    Time  12    Period  Weeks    Status  New    Target Date  05/07/18            Plan - 03/24/18 1716    Clinical Impression Statement  Pt has been traveling and today is her first treatment since initial evaluation.  She noted improved Lt arm pain while on vacation which she attributed to less stress.  She continues to have Lt sided neck pain.  Pt presents with significant limitation in lower cervical and upper thoracic joint mobility and Lt sided soft tissue restrictions.  PT performed manual therapy to address these deficits and provided beginning of HEP for stretching, stabilization and ROM.  She will continue to benefit from skilled PT to address her pain and mobility.    PT Frequency  2x / week    PT Duration  12 weeks    PT Treatment/Interventions  ADLs/Self Care Home Management;Electrical Stimulation;Moist Heat;Traction;Functional mobility training;Therapeutic activities;Therapeutic exercise;Patient/family education;Manual techniques;Passive range of motion;Dry needling;Taping    PT Next Visit Plan  continue manual therapy, add dry needling, ROM, stab exercises    PT Home Exercise Plan  Access Code: [email or web address]       Patient will benefit from skilled therapeutic intervention in order to improve the following deficits and impairments:  Decreased range of motion, Increased muscle spasms, Pain, Decreased mobility, Postural dysfunction, Improper body mechanics, Hypomobility  Visit Diagnosis: Chronic left shoulder pain  Cervicalgia     Problem List Patient Active Problem List   Diagnosis Date Noted  . Syncope, vasovagal 10/31/2017  . Hyponatremia 10/31/2017   Baruch Merl, PT 03/24/18 5:18 PM   [facility] [address] [place], [place], [address] Phone: [phone]   Fax:   [phone]  Name: Magdalen Spatz MRN:  [id number] Date of Birth: 1934-12-11

## 2018-03-24 NOTE — Patient Instructions (Signed)
Access Code: [facility]  URL: [email or web address]  Date: 03/24/2018  Prepared by: Venetia Night Daemian Gahm   Exercises  Neck Rotation - 10 reps - 1 sets - 1x daily - 7x weekly  Seated Cervical Sidebending Stretch - 2 reps - 1 sets - 30 hold - 1x daily - 7x weekly  Seated Levator Scapulae Stretch - 2 reps - 1 sets - 30 hold - 1x daily - 7x weekly  Seated Upper Trapezius Stretch - 2 reps - 1 sets - 30 hold - 1x daily - 7x weekly

## 2018-03-26 ENCOUNTER — Ambulatory Visit: Payer: Federal, State, Local not specified - PPO | Attending: Family Medicine | Admitting: Physical Therapy

## 2018-03-26 ENCOUNTER — Encounter: Payer: Self-pay | Admitting: Physical Therapy

## 2018-03-26 DIAGNOSIS — G8929 Other chronic pain: Secondary | ICD-10-CM | POA: Diagnosis present

## 2018-03-26 DIAGNOSIS — M542 Cervicalgia: Secondary | ICD-10-CM | POA: Diagnosis present

## 2018-03-26 DIAGNOSIS — R252 Cramp and spasm: Secondary | ICD-10-CM | POA: Insufficient documentation

## 2018-03-26 DIAGNOSIS — M25512 Pain in left shoulder: Secondary | ICD-10-CM | POA: Diagnosis not present

## 2018-03-26 NOTE — Therapy (Signed)
[facility] [address] [place], [place], [address] Phone: [phone]   Fax:  [phone]  Physical Therapy Treatment  Patient Details  Name: Ashley Solomon MRN: [id number] Date of Birth: Dec 02, 1934 Referring Provider (PT): [name], MD   Encounter Date: 03/26/2018  PT End of Session - 03/26/18 1148    Visit Number  3    Date for PT Re-Evaluation  04/09/18    Authorization Type  [employer] FEDERAL    PT Start Time  1105    PT Stop Time  1158   10 min heat end of session   PT Time Calculation (min)  53 min    Activity Tolerance  Patient tolerated treatment well    Behavior During Therapy  [facility] for tasks assessed/performed       Past Medical History:  Diagnosis Date  . Displaced fracture of distal end of left radius 07/07/2015  . Hyponatremia 10/31/2017  . Syncope 10/31/2017  . Syncope, vasovagal 10/31/2017    Past Surgical History:  Procedure Laterality Date  . ABDOMINAL HYSTERECTOMY    . BREAST EXCISIONAL BIOPSY Left 2004  . ORIF WRIST FRACTURE Left 07/08/2015   Procedure: OPEN REDUCTION INTERNAL FIXATION (ORIF) WRIST FRACTURE;  Surgeon: Roseanne Kaufman, MD;  Location: [place];  Service: Orthopedics;  Laterality: Left;  . REPLACEMENT TOTAL KNEE Right     There were no vitals filed for this visit.  Subjective Assessment - 03/26/18 1110    Subjective  Pt reported significant soreness after last visit with a headache.  Pt feels back to baseline level pain today.    Patient Stated Goals  keep symptoms contained, not allow it to get worse    Currently in Pain?  Yes    Pain Score  6     Pain Location  Neck    Pain Orientation  Left    Pain Descriptors / Indicators  Aching;Nagging    Pain Onset  More than a month ago    Pain Frequency  Intermittent    Effect of Pain on Daily Activities  sleeping, gets headache                       [facility] Adult PT Treatment/Exercise - 03/26/18 0001      Exercises    Exercises  Neck      Neck Exercises: Seated   Other Seated Exercise  Lt upper trap and Lt levator stretch 1x30 sec each    Other Seated Exercise  reviewed neck retraction, AROM cerivcal rotation      Modalities   Modalities  Moist Heat      Moist Heat Therapy   Number Minutes Moist Heat  10 Minutes    Moist Heat Location  Cervical      Manual Therapy   Manual Therapy  Soft tissue mobilization    Soft tissue mobilization  Lt upper trap after dry needling       Trigger Point Dry Needling - 03/26/18 1146    Consent Given?  Yes    Education Handout Provided  Yes    Muscles Treated Upper Body  Upper trapezius   Left   Upper Trapezius Response  Twitch reponse elicited;Palpable increased muscle length             PT Short Term Goals - 03/24/18 1717      PT SHORT TERM GOAL #1   Title  Pt will be independent in initial HEP  Time  6    Period  Weeks    Status  On-going   Pt traveled and today was first treatment     PT SHORT TERM GOAL #2   Title  Pt will report 25% less sleep disturbances due to Lt shoulder pain.    Time  8    Period  Weeks    Status  Achieved      PT SHORT TERM GOAL #3   Title  Pt will verbalize and put into practice avoidance of cervical Lt sidebending/rotation/extension positions to reduce pain occurences by 50%.    Time  8    Period  Weeks    Status  Achieved        PT Long Term Goals - 02/12/18 1408      PT LONG TERM GOAL #1   Title  Pt will be independent in long-term HEP.    Time  12    Period  Weeks    Status  New    Target Date  05/07/18      PT LONG TERM GOAL #2   Title  Pt will report 50% less sleep disturbances due to Lt arm pain.    Time  12    Period  Weeks    Status  New    Target Date  05/07/18      PT LONG TERM GOAL #3   Title  Pt will report pain reduction by 50% or more throughout daily tasks.    Time  12    Period  Weeks    Status  New    Target Date  05/07/18            Plan - 03/26/18 1149     Clinical Impression Statement  Pt was able to perform painfree Lt neck rotation end of session as compared to pulling pain beginning of session.  Her Lt upper trap had significant twitch repsonse with dry needling and released well.  Tissue was WNL along muscle length with f/u soft tissue mobilization.  Pt felt more comfort with neck stretches and other HEP following manual therapy today.  She will continue to benefit from skilled PT to address her pain and progress ROM and stabilization exercises.    Rehab Potential  Excellent    PT Frequency  2x / week    PT Duration  12 weeks    PT Treatment/Interventions  ADLs/Self Care Home Management;Electrical Stimulation;Moist Heat;Traction;Functional mobility training;Therapeutic activities;Therapeutic exercise;Patient/family education;Manual techniques;Passive range of motion;Dry needling;Taping    PT Next Visit Plan  f/u on dry needling response, cont manual therapy, stretching and begin stabilization     PT Home Exercise Plan  Access Code: [email or web address]    Consulted and Agree with Plan of Care  Patient       Patient will benefit from skilled therapeutic intervention in order to improve the following deficits and impairments:  Decreased range of motion, Increased muscle spasms, Pain, Decreased mobility, Postural dysfunction, Improper body mechanics, Hypomobility  Visit Diagnosis: Chronic left shoulder pain  Cervicalgia  Cramp and spasm     Problem List Patient Active Problem List   Diagnosis Date Noted  . Syncope, vasovagal 10/31/2017  . Hyponatremia 10/31/2017    Baruch Merl, PT 03/26/18 11:52 AM   [facility] [address] [place], [place], [address] Phone: [phone]   Fax:  [phone]  Name: Ashley Solomon MRN: [id number] Date of Birth: 1934-07-21

## 2018-03-29 ENCOUNTER — Encounter: Payer: Self-pay | Admitting: Physical Therapy

## 2018-03-29 ENCOUNTER — Ambulatory Visit: Payer: Federal, State, Local not specified - PPO | Admitting: Physical Therapy

## 2018-03-29 DIAGNOSIS — R252 Cramp and spasm: Secondary | ICD-10-CM

## 2018-03-29 DIAGNOSIS — M25512 Pain in left shoulder: Principal | ICD-10-CM

## 2018-03-29 DIAGNOSIS — M542 Cervicalgia: Secondary | ICD-10-CM

## 2018-03-29 DIAGNOSIS — G8929 Other chronic pain: Secondary | ICD-10-CM

## 2018-03-29 NOTE — Therapy (Signed)
[facility] [address] [place], [place], [address] Phone: [phone]   Fax:  [phone]  Physical Therapy Treatment  Patient Details  Name: Ashley Solomon MRN: [id number] Date of Birth: 1934-07-20 Referring Provider (PT): [name], MD   Encounter Date: 03/29/2018  PT End of Session - 03/29/18 1449    Visit Number  4    Date for PT Re-Evaluation  04/09/18    Authorization Type  [employer] FEDERAL    PT Start Time  1400    PT Stop Time  1445    PT Time Calculation (min)  45 min    Activity Tolerance  Patient tolerated treatment well    Behavior During Therapy  [facility] for tasks assessed/performed       Past Medical History:  Diagnosis Date  . Displaced fracture of distal end of left radius 07/07/2015  . Hyponatremia 10/31/2017  . Syncope 10/31/2017  . Syncope, vasovagal 10/31/2017    Past Surgical History:  Procedure Laterality Date  . ABDOMINAL HYSTERECTOMY    . BREAST EXCISIONAL BIOPSY Left 2004  . ORIF WRIST FRACTURE Left 07/08/2015   Procedure: OPEN REDUCTION INTERNAL FIXATION (ORIF) WRIST FRACTURE;  Surgeon: Roseanne Kaufman, MD;  Location: [place];  Service: Orthopedics;  Laterality: Left;  . REPLACEMENT TOTAL KNEE Right     There were no vitals filed for this visit.  Subjective Assessment - 03/29/18 1400    Subjective  Pt reported great relief from dry needling last visit until last night when she pulled her SB neck stretch too hard/far.      Patient Stated Goals  keep symptoms contained, not allow it to get worse    Currently in Pain?  Yes    Pain Score  7    with Lt cervical rotation   Pain Location  Neck    Pain Orientation  Left    Pain Descriptors / Indicators  Aching    Pain Type  Chronic pain    Pain Onset  More than a month ago    Pain Frequency  Intermittent                       [facility] Adult PT Treatment/Exercise - 03/29/18 0001      Exercises   Exercises  Shoulder       Shoulder Exercises: Standing   Horizontal ABduction  Strengthening;15 reps    Theraband Level (Shoulder Horizontal ABduction)  Level 1 (Yellow)    External Rotation  Strengthening;Both;10 reps    Theraband Level (Shoulder External Rotation)  Level 1 (Yellow)    Row  Strengthening;15 reps;Theraband;Both    Theraband Level (Shoulder Row)  Level 1 (Yellow)      Manual Therapy   Manual Therapy  Soft tissue mobilization    Soft tissue mobilization  post needling, all muscles that were needled       Trigger Point Dry Needling - 03/29/18 1435    Consent Given?  Yes    Muscles Treated Upper Body  Upper trapezius;Supraspinatus;Infraspinatus   Left   Upper Trapezius Response  Twitch reponse elicited;Palpable increased muscle length    Supraspinatus Response  Twitch response elicited;Palpable increased muscle length    Infraspinatus Response  Twitch response elicited;Palpable increased muscle length           PT Education - 03/29/18 1445    Education Details  Access Code: [email or web address]     Person(s) Educated  Patient    Methods  Explanation;Demonstration;Verbal  cues;Handout    Comprehension  Verbalized understanding;Returned demonstration       PT Short Term Goals - 03/24/18 1717      PT SHORT TERM GOAL #1   Title  Pt will be independent in initial HEP    Time  6    Period  Weeks    Status  On-going   Pt traveled and today was first treatment     PT SHORT TERM GOAL #2   Title  Pt will report 25% less sleep disturbances due to Lt shoulder pain.    Time  8    Period  Weeks    Status  Achieved      PT SHORT TERM GOAL #3   Title  Pt will verbalize and put into practice avoidance of cervical Lt sidebending/rotation/extension positions to reduce pain occurences by 50%.    Time  8    Period  Weeks    Status  Achieved        PT Long Term Goals - 02/12/18 1408      PT LONG TERM GOAL #1   Title  Pt will be independent in long-term HEP.    Time  12    Period  Weeks    Status   New    Target Date  05/07/18      PT LONG TERM GOAL #2   Title  Pt will report 50% less sleep disturbances due to Lt arm pain.    Time  12    Period  Weeks    Status  New    Target Date  05/07/18      PT LONG TERM GOAL #3   Title  Pt will report pain reduction by 50% or more throughout daily tasks.    Time  12    Period  Weeks    Status  New    Target Date  05/07/18            Plan - 03/29/18 1449    Clinical Impression Statement  Pt with good tolerance of dry needling today and was able to perform Lt neck rotation without pain end of session.  Combination of dry needling and ther ex with yellow band worked well together as pain wasn't fully resolved after dry needling but was with performance of ther ex.  Pt required min cueing for proper performance of ther ex today.  She will continue to benefit from skilled PT along POC to address deficits and pain.    Rehab Potential  Excellent    PT Frequency  2x / week    PT Duration  12 weeks    PT Treatment/Interventions  ADLs/Self Care Home Management;Electrical Stimulation;Moist Heat;Traction;Functional mobility training;Therapeutic activities;Therapeutic exercise;Patient/family education;Manual techniques;Passive range of motion;Dry needling;Taping    PT Next Visit Plan  f/u on dry needling response, cont manual therapy, stretching and begin stabilization     PT Home Exercise Plan  Access Code: [email or web address]    Consulted and Agree with Plan of Care  Patient       Patient will benefit from skilled therapeutic intervention in order to improve the following deficits and impairments:  Decreased range of motion, Increased muscle spasms, Pain, Decreased mobility, Postural dysfunction, Improper body mechanics, Hypomobility  Visit Diagnosis: Chronic left shoulder pain  Cervicalgia  Cramp and spasm     Problem List Patient Active Problem List   Diagnosis Date Noted  . Syncope, vasovagal 10/31/2017  . Hyponatremia 10/31/2017    Baruch Merl, PT 03/29/18  2:53 PM    [facility] [address], [place], [place], [address] Phone: [phone]   Fax:  [phone]  Name: Ashley Solomon MRN: [id number] Date of Birth: 25-Jul-1934

## 2018-03-29 NOTE — Patient Instructions (Signed)
Access Code: [email or web address]  URL: [email or web address]  Date: 03/29/2018  Prepared by: Venetia Night Ephrata Verville   Exercises  Seated Upper Trapezius Stretch - 2 reps - 1 sets - 30 hold - 1x daily - 7x weekly  Seated Levator Scapulae Stretch - 2 reps - 1 sets - 30 hold - 1x daily - 7x weekly  Standing Row with Anchored Resistance - 15 reps - 2 sets - 1x daily - 7x weekly  Seated Shoulder Horizontal Abduction with Resistance - 15 reps - 1 sets - 1x daily - 7x weekly  Shoulder External Rotation - 15 reps - 1 sets - 1x daily - 7x weekly

## 2018-03-31 ENCOUNTER — Encounter: Payer: Self-pay | Admitting: Physical Therapy

## 2018-03-31 ENCOUNTER — Ambulatory Visit: Payer: Federal, State, Local not specified - PPO | Admitting: Physical Therapy

## 2018-03-31 DIAGNOSIS — G8929 Other chronic pain: Secondary | ICD-10-CM

## 2018-03-31 DIAGNOSIS — M542 Cervicalgia: Secondary | ICD-10-CM

## 2018-03-31 DIAGNOSIS — R252 Cramp and spasm: Secondary | ICD-10-CM

## 2018-03-31 DIAGNOSIS — M25512 Pain in left shoulder: Secondary | ICD-10-CM | POA: Diagnosis not present

## 2018-03-31 NOTE — Therapy (Signed)
[facility] [address], [place], [place], [address] Phone: [phone]   Fax:  [phone]  Physical Therapy Treatment  Patient Details  Name: Ashley Solomon MRN: [id number] Date of Birth: 1935-03-10 Referring Provider (PT): [name], MD   Encounter Date: 03/31/2018  PT End of Session - 03/31/18 1359    Visit Number  5    Date for PT Re-Evaluation  04/09/18    Authorization Type  [employer] FEDERAL    PT Start Time  7867    PT Stop Time  1442    PT Time Calculation (min)  43 min    Activity Tolerance  Patient tolerated treatment well    Behavior During Therapy  [facility] for tasks assessed/performed       Past Medical History:  Diagnosis Date  . Displaced fracture of distal end of left radius 07/07/2015  . Hyponatremia 10/31/2017  . Syncope 10/31/2017  . Syncope, vasovagal 10/31/2017    Past Surgical History:  Procedure Laterality Date  . ABDOMINAL HYSTERECTOMY    . BREAST EXCISIONAL BIOPSY Left 2004  . ORIF WRIST FRACTURE Left 07/08/2015   Procedure: OPEN REDUCTION INTERNAL FIXATION (ORIF) WRIST FRACTURE;  Surgeon: Roseanne Kaufman, MD;  Location: [facility] [place];  Service: Orthopedics;  Laterality: Left;  . REPLACEMENT TOTAL KNEE Right     There were no vitals filed for this visit.                    [place] Adult PT Treatment/Exercise - 03/31/18 0001      Exercises   Exercises  Neck      Neck Exercises: Seated   Other Seated Exercise  cervical rotation A/ROM bil with PT cueing to avoid cervical extension at end range of pure rotation to avoid pain      Neck Exercises: Supine   Cervical Isometrics  Right rotation;Left rotation;5 secs;5 reps    Cervical Isometrics Limitations  with PT providing resistance for proper amount of pressure    Neck Retraction  15 reps;5 secs    Neck Retraction Limitations  PT provided tactile cues under neck    Cervical Rotation  Left;Right;20 reps    Cervical Rotation  Limitations  A/ROM and with manual resistance by PT, 10 each, both directions    Other Supine Exercise  cervical extension/rotation diagonal from cervical flexion, concentric/eccentric with PT provided manual resistance             PT Education - 03/31/18 1440    Education Details  Access Code: [email or web address]    Person(s) Educated  Patient    Methods  Explanation;Demonstration;Verbal cues;Handout    Comprehension  Verbalized understanding;Returned demonstration;Verbal cues required       PT Short Term Goals - 03/24/18 1717      PT SHORT TERM GOAL #1   Title  Pt will be independent in initial HEP    Time  6    Period  Weeks    Status  On-going   Pt traveled and today was first treatment     PT SHORT TERM GOAL #2   Title  Pt will report 25% less sleep disturbances due to Lt shoulder pain.    Time  8    Period  Weeks    Status  Achieved      PT SHORT TERM GOAL #3   Title  Pt will verbalize and put into practice avoidance of cervical Lt sidebending/rotation/extension positions to reduce pain occurences by 50%.  Time  8    Period  Weeks    Status  Achieved        PT Long Term Goals - 02/12/18 1408      PT LONG TERM GOAL #1   Title  Pt will be independent in long-term HEP.    Time  12    Period  Weeks    Status  New    Target Date  05/07/18      PT LONG TERM GOAL #2   Title  Pt will report 50% less sleep disturbances due to Lt arm pain.    Time  12    Period  Weeks    Status  New    Target Date  05/07/18      PT LONG TERM GOAL #3   Title  Pt will report pain reduction by 50% or more throughout daily tasks.    Time  12    Period  Weeks    Status  New    Target Date  05/07/18            Plan - 03/31/18 1442    Clinical Impression Statement  Pt with maintained release of Lt upper trap and posterior cervical musculature from last visit.  She continues to get Lt neck pain with Lt cervical rotation that extends into Lt arm but PT noted that she performs  extension with rotation which further closes Lt foraminal openings.  She now understands and is working on cervical retraction and remaining in pure Lt rotation plane with active motion which yields smaller range of motion but is painfree.  She will continue to benefit from skilled PT to address cervical stabilization and postural strength needs.    Rehab Potential  Excellent    PT Frequency  2x / week    PT Duration  12 weeks    PT Treatment/Interventions  ADLs/Self Care Home Management;Electrical Stimulation;Moist Heat;Traction;Functional mobility training;Therapeutic activities;Therapeutic exercise;Patient/family education;Manual techniques;Passive range of motion;Dry needling;Taping    PT Next Visit Plan  continue cervical/postural strengthening    PT Home Exercise Plan  Access Code: [email or web address]    Consulted and Agree with Plan of Care  Patient       Patient will benefit from skilled therapeutic intervention in order to improve the following deficits and impairments:  Decreased range of motion, Increased muscle spasms, Pain, Decreased mobility, Postural dysfunction, Improper body mechanics, Hypomobility  Visit Diagnosis: Chronic left shoulder pain  Cervicalgia  Cramp and spasm     Problem List Patient Active Problem List   Diagnosis Date Noted  . Syncope, vasovagal 10/31/2017  . Hyponatremia 10/31/2017   Baruch Merl, PT 03/31/18 6:08 PM   [facility] [address] [place], [place], [address] Phone: [phone]   Fax:  [phone]  Name: Ashley Solomon MRN: [id number] Date of Birth: 1934/10/12

## 2018-03-31 NOTE — Patient Instructions (Signed)
Access Code: [email or web address]  URL: [email or web address]  Date: 03/31/2018  Prepared by: Venetia Night Keelon Zurn   Exercises  Seated Upper Trapezius Stretch - 2 reps - 1 sets - 30 hold - 1x daily - 7x weekly  Seated Levator Scapulae Stretch - 2 reps - 1 sets - 30 hold - 1x daily - 7x weekly  Standing Row with Anchored Resistance - 15 reps - 2 sets - 1x daily - 7x weekly  Seated Shoulder Horizontal Abduction with Resistance - 15 reps - 1 sets - 1x daily - 7x weekly  Shoulder External Rotation - 15 reps - 1 sets - 1x daily - 7x weekly  Supine Chin Tuck - 10 reps - 1 sets - 5 hold - 1x daily - 7x weekly  Supine Cervical Rotation AROM on Pillow - 10 reps - 2 sets - 1x daily - 7x weekly

## 2018-04-07 ENCOUNTER — Encounter: Payer: Self-pay | Admitting: Physical Therapy

## 2018-04-07 ENCOUNTER — Ambulatory Visit: Payer: Federal, State, Local not specified - PPO | Admitting: Physical Therapy

## 2018-04-07 DIAGNOSIS — M25512 Pain in left shoulder: Secondary | ICD-10-CM | POA: Diagnosis not present

## 2018-04-07 DIAGNOSIS — M542 Cervicalgia: Secondary | ICD-10-CM

## 2018-04-07 DIAGNOSIS — G8929 Other chronic pain: Secondary | ICD-10-CM

## 2018-04-07 DIAGNOSIS — R252 Cramp and spasm: Secondary | ICD-10-CM

## 2018-04-07 NOTE — Therapy (Signed)
[facility] [address], [place], [place], [address] Phone: [phone]   Fax:  [phone]  Physical Therapy Treatment  Patient Details  Name: [facility] MRN: [id number] Date of Birth: 06/01/1934 Referring Provider (PT): [name], MD   Encounter Date: 04/07/2018  PT End of Session - 04/07/18 1439    Visit Number  6    Date for PT Re-Evaluation  04/09/18    Authorization Type  [employer] FEDERAL    PT Start Time  1400    PT Stop Time  1442    PT Time Calculation (min)  42 min    Activity Tolerance  Patient tolerated treatment well    Behavior During Therapy  [facility] for tasks assessed/performed       Past Medical History:  Diagnosis Date  . Displaced fracture of distal end of left radius 07/07/2015  . Hyponatremia 10/31/2017  . Syncope 10/31/2017  . Syncope, vasovagal 10/31/2017    Past Surgical History:  Procedure Laterality Date  . ABDOMINAL HYSTERECTOMY    . BREAST EXCISIONAL BIOPSY Left 2004  . ORIF WRIST FRACTURE Left 07/08/2015   Procedure: OPEN REDUCTION INTERNAL FIXATION (ORIF) WRIST FRACTURE;  Surgeon: Roseanne Kaufman, MD;  Location: [place];  Service: Orthopedics;  Laterality: Left;  . REPLACEMENT TOTAL KNEE Right     There were no vitals filed for this visit.  Subjective Assessment - 04/07/18 1404    Subjective  Pt reported she has improved significantly with PT.  She has no Lt arm pain and now understands how to move to avoid Lt neck pain.      Patient Stated Goals  keep symptoms contained, not allow it to get worse    Currently in Pain?  No/denies    Pain Onset  More than a month ago                       [facility] Adult PT Treatment/Exercise - 04/07/18 0001      Exercises   Exercises  Neck;Shoulder      Neck Exercises: Seated   Other Seated Exercise  cervical rotation A/ROM bil with PT cueing to avoid cervical extension at end range of pure rotation to avoid pain      Neck  Exercises: Supine   Neck Retraction  15 reps;5 secs      Shoulder Exercises: Standing   Horizontal ABduction  Strengthening;15 reps    Theraband Level (Shoulder Horizontal ABduction)  Level 1 (Yellow)    External Rotation  Strengthening;Both;10 reps    Theraband Level (Shoulder External Rotation)  Level 1 (Yellow)    Flexion  Strengthening;Both;10 reps;Theraband    Theraband Level (Shoulder Flexion)  Level 1 (Yellow)    Extension  Strengthening;Both;Theraband;10 reps    Theraband Level (Shoulder Extension)  Level 1 (Yellow)    Row  Strengthening;15 reps;Theraband;Both    Theraband Level (Shoulder Row)  Level 1 (Yellow)      Manual Therapy   Soft tissue mobilization  Lt upper trap after dry needling       Trigger Point Dry Needling - 04/07/18 1419    Consent Given?  Yes    Muscles Treated Upper Body  Upper trapezius   Lt   Upper Trapezius Response  Twitch reponse elicited;Palpable increased muscle length             PT Short Term Goals - 03/24/18 1717      PT SHORT TERM GOAL #1   Title  Pt will be independent in initial HEP    Time  6    Period  Weeks    Status  On-going   Pt traveled and today was first treatment     PT SHORT TERM GOAL #2   Title  Pt will report 25% less sleep disturbances due to Lt shoulder pain.    Time  8    Period  Weeks    Status  Achieved      PT SHORT TERM GOAL #3   Title  Pt will verbalize and put into practice avoidance of cervical Lt sidebending/rotation/extension positions to reduce pain occurences by 50%.    Time  8    Period  Weeks    Status  Achieved        PT Long Term Goals - 02/12/18 1408      PT LONG TERM GOAL #1   Title  Pt will be independent in long-term HEP.    Time  12    Period  Weeks    Status  New    Target Date  05/07/18      PT LONG TERM GOAL #2   Title  Pt will report 50% less sleep disturbances due to Lt arm pain.    Time  12    Period  Weeks    Status  New    Target Date  05/07/18      PT LONG  TERM GOAL #3   Title  Pt will report pain reduction by 50% or more throughout daily tasks.    Time  12    Period  Weeks    Status  New    Target Date  05/07/18            Plan - 04/07/18 1439    Clinical Impression Statement  Pt with much improved pain and better knowledge of source of pain and how to avoid/manage.  She had mild reaction of Lt upper trap to dry needling today but much less compared to previous visits.  Tissues in Lt cervical region are normalizing for flexibility and Pt demonstrates improving awareness of posture and proper ther ex performance.  Lt arm pain has resolved.  She will continue to benefit from continued skilled PT to address remaining strength deficits and continued postural stabilization with manual therapy as needed.    Rehab Potential  Excellent    PT Frequency  2x / week    PT Duration  12 weeks    PT Treatment/Interventions  ADLs/Self Care Home Management;Electrical Stimulation;Moist Heat;Traction;Functional mobility training;Therapeutic activities;Therapeutic exercise;Patient/family education;Manual techniques;Passive range of motion;Dry needling;Taping    PT Next Visit Plan  add shoulder flexion and extension with yellow band to HEP    PT Home Exercise Plan  Access Code: [email or web address]    Consulted and Agree with Plan of Care  Patient       Patient will benefit from skilled therapeutic intervention in order to improve the following deficits and impairments:  Decreased range of motion, Increased muscle spasms, Pain, Decreased mobility, Postural dysfunction, Improper body mechanics, Hypomobility  Visit Diagnosis: Chronic left shoulder pain  Cervicalgia  Cramp and spasm     Problem List Patient Active Problem List   Diagnosis Date Noted  . Syncope, vasovagal 10/31/2017  . Hyponatremia 10/31/2017   Baruch Merl, PT 04/07/18 2:43 PM    [facility] [address], [place], [place], [address] Phone: [phone]   Fax:  [phone]  Name:  Ashley Solomon MRN: [id number] Date of Birth: 1935-04-13

## 2018-04-09 ENCOUNTER — Encounter: Payer: Self-pay | Admitting: Physical Therapy

## 2018-04-09 ENCOUNTER — Ambulatory Visit: Payer: Federal, State, Local not specified - PPO | Admitting: Physical Therapy

## 2018-04-09 DIAGNOSIS — M25512 Pain in left shoulder: Secondary | ICD-10-CM | POA: Diagnosis not present

## 2018-04-09 DIAGNOSIS — M542 Cervicalgia: Secondary | ICD-10-CM

## 2018-04-09 DIAGNOSIS — G8929 Other chronic pain: Secondary | ICD-10-CM

## 2018-04-09 DIAGNOSIS — R252 Cramp and spasm: Secondary | ICD-10-CM

## 2018-04-09 NOTE — Patient Instructions (Signed)
Access Code: [email or web address]  URL: [email or web address]  Date: 04/09/2018  Prepared by: Venetia Night Jeana Kersting   Exercises  Seated Upper Trapezius Stretch - 2 reps - 1 sets - 30 hold - 1x daily - 7x weekly  Seated Levator Scapulae Stretch - 2 reps - 1 sets - 30 hold - 1x daily - 7x weekly  Standing Row with Anchored Resistance - 15 reps - 2 sets - 1x daily - 7x weekly  Seated Shoulder Horizontal Abduction with Resistance - 15 reps - 1 sets - 1x daily - 7x weekly  Shoulder External Rotation - 15 reps - 1 sets - 1x daily - 7x weekly  Supine Chin Tuck - 10 reps - 1 sets - 5 hold - 1x daily - 7x weekly  Supine Cervical Rotation AROM on Pillow - 10 reps - 2 sets - 1x daily - 7x weekly  Standing Shoulder Flexion with Resistance - 15 reps - 2 sets - 1x daily - 7x weekly  Shoulder extension with resistance - Neutral - 15 reps - 2 sets - 1x daily - 7x weekly

## 2018-04-09 NOTE — Therapy (Signed)
[facility] [address] [place], [place], [address] Phone: [phone]   Fax:  [phone]  Physical Therapy Treatment  Patient Details  Name: Ashley Solomon MRN: [id number] Date of Birth: Jan 10, 1935 Referring Provider (PT): [name], MD   Encounter Date: 04/09/2018  PT End of Session - 04/09/18 1110    Visit Number  7    Date for PT Re-Evaluation  04/09/18    Authorization Type  [employer]BS FEDERAL    PT Start Time  1101    PT Stop Time  1141    PT Time Calculation (min)  40 min    Activity Tolerance  Patient tolerated treatment well    Behavior During Therapy  [facility] for tasks assessed/performed       Past Medical History:  Diagnosis Date  . Displaced fracture of distal end of left radius 07/07/2015  . Hyponatremia 10/31/2017  . Syncope 10/31/2017  . Syncope, vasovagal 10/31/2017    Past Surgical History:  Procedure Laterality Date  . ABDOMINAL HYSTERECTOMY    . BREAST EXCISIONAL BIOPSY Left 2004  . ORIF WRIST FRACTURE Left 07/08/2015   Procedure: OPEN REDUCTION INTERNAL FIXATION (ORIF) WRIST FRACTURE;  Surgeon: Roseanne Kaufman, MD;  Location: [place];  Service: Orthopedics;  Laterality: Left;  . REPLACEMENT TOTAL KNEE Right     There were no vitals filed for this visit.  Subjective Assessment - 04/09/18 1102    Subjective  Pt states she has improved significantly since starting PT - 90%.  Lt arm pain has resolved and Pt is able to perform all daily duties without increase in pain.  She continues to have end range pain with left rotation but pain has minimized significantly and doesn't limit daily activity or sleep.      Patient Stated Goals  keep symptoms contained, not allow it to get worse    Currently in Pain?  No/denies    Pain Onset  More than a month ago         [facility] PT Assessment - 04/09/18 0001      Assessment   Medical Diagnosis  M25.512 (ICD-10-CM) - Pain in left shoulder    Referring Provider  (PT)  [name], MD    Hand Dominance  Right    Prior Therapy  no      Observation/Other Assessments   Focus on Therapeutic Outcomes (FOTO)   8      ROM / Strength   AROM / PROM / Strength  AROM      AROM   Overall AROM Comments  full neck ROM with mild end range pain Lt rot      Strength   Overall Strength  Within functional limits for tasks performed    Overall Strength Comments  bil UEs and cervical cardinal planes                   [facility] Adult PT Treatment/Exercise - 04/09/18 0001      Exercises   Exercises  Shoulder      Neck Exercises: Supine   Neck Retraction  10 reps    Neck Retraction Limitations  with bil cervical rotation AROM 10 reps      Shoulder Exercises: Standing   Horizontal ABduction  Strengthening;15 reps    Theraband Level (Shoulder Horizontal ABduction)  Level 2 (Red)    External Rotation  Strengthening;Both;15 reps    Theraband Level (Shoulder External Rotation)  Level 1 (Yellow)   PT cued Pt to  perform with palms up   Flexion  Strengthening;Both;Theraband;15 reps    Theraband Level (Shoulder Flexion)  Level 2 (Red)    Extension  Strengthening;Both;Theraband;15 reps    Theraband Level (Shoulder Extension)  Level 2 (Red)    Row  Strengthening;15 reps;Theraband;Both    Theraband Level (Shoulder Row)  Level 2 (Red)             PT Education - 04/09/18 1139    Education Details  Access Code: [email or web address]    Person(s) Educated  Patient    Methods  Explanation;Demonstration;Handout;Verbal cues    Comprehension  Verbalized understanding;Returned demonstration       PT Short Term Goals - 04/09/18 1107      PT SHORT TERM GOAL #1   Title  Pt will be independent in initial HEP    Time  6    Period  Weeks    Status  Achieved   Pt continues to rely on videos for form     PT SHORT TERM GOAL #2   Title  Pt will report 25% less sleep disturbances due to Lt shoulder pain.    Time  8    Period  Weeks    Status  Achieved      PT  SHORT TERM GOAL #3   Title  Pt will verbalize and put into practice avoidance of cervical Lt sidebending/rotation/extension positions to reduce pain occurences by 50%.    Time  8    Status  Achieved        PT Long Term Goals - 04/09/18 1108      PT LONG TERM GOAL #1   Title  Pt will be independent in long-term HEP.    Time  8    Period  Weeks    Status  Achieved      PT LONG TERM GOAL #2   Title  Pt will report 50% less sleep disturbances due to Lt arm pain.    Time  8    Period  Weeks    Status  Achieved      PT LONG TERM GOAL #3   Title  Pt will report pain reduction by 50% or more throughout daily tasks.    Time  8    Period  Weeks    Status  Achieved            Plan - 04/09/18 1149    Clinical Impression Statement  Pt reports 90% improvement with PT.  Dry needling coupled with postural re-ed and cervical/thoracic/UE strengthening have helped resolve Lt UE completeley and reduced neck pain to only occurring at end range of full Lt Rot.  Pt states she is able to perform all desired daily tasks without pain and is now sleeping through the night without pain epidodes.  Her FOTO score of limitation reduced from 20% to 8%.  PT reviewed her HEP and made additional notes for form cueing for home reference today and PT and Pt agreed that she is ready for D/C to HEP with follow up as needed.    Rehab Potential  Excellent    PT Frequency  2x / week    PT Duration  8 weeks    PT Treatment/Interventions  ADLs/Self Care Home Management;Electrical Stimulation;Moist Heat;Traction;Functional mobility training;Therapeutic activities;Therapeutic exercise;Patient/family education;Manual techniques;Passive range of motion;Dry needling;Taping    PT Next Visit Plan  D/C to HEP with follow up as needed    PT Home Exercise Plan  Access Code: [email or web address]  Consulted and Agree with Plan of Care  Patient       Patient will benefit from skilled therapeutic intervention in order to improve the  following deficits and impairments:  Decreased range of motion, Increased muscle spasms, Pain, Decreased mobility, Postural dysfunction, Improper body mechanics, Hypomobility  Visit Diagnosis: Chronic left shoulder pain  Cervicalgia  Cramp and spasm     Problem List Patient Active Problem List   Diagnosis Date Noted  . Syncope, vasovagal 10/31/2017  . Hyponatremia 10/31/2017    Baruch Merl, PT 04/09/18 11:53 AM  PHYSICAL THERAPY DISCHARGE SUMMARY  Visits from Start of Care: 7  Current functional level related to goals / functional outcomes: Pt has met all goals and reported 90% improvement with PT.  FOTO score reduced to 8% from 20% limitation.  Lt arm pain has resolved completely and neck pain is present only at end range of full Lt Rot.     Remaining deficits: See above   Education / Equipment: HEP, therabands Plan: Patient agrees to discharge.  Patient goals were met. Patient is being discharged due to                                                     ?????        [facility] [address], [place], [place], [address] Phone: [phone]   Fax:  [phone]  Name: Ashley Solomon MRN: [id number] Date of Birth: 1935-04-11

## 2018-04-12 ENCOUNTER — Ambulatory Visit: Payer: Federal, State, Local not specified - PPO | Admitting: Physical Therapy

## 2018-04-14 ENCOUNTER — Ambulatory Visit: Payer: Federal, State, Local not specified - PPO | Admitting: Physical Therapy

## 2018-04-19 ENCOUNTER — Encounter: Payer: Federal, State, Local not specified - PPO | Admitting: Physical Therapy

## 2018-04-21 ENCOUNTER — Encounter: Payer: Federal, State, Local not specified - PPO | Admitting: Physical Therapy

## 2018-10-22 ENCOUNTER — Other Ambulatory Visit: Payer: Self-pay | Admitting: Family Medicine

## 2018-10-22 DIAGNOSIS — Z1231 Encounter for screening mammogram for malignant neoplasm of breast: Secondary | ICD-10-CM

## 2018-12-08 ENCOUNTER — Encounter (HOSPITAL_COMMUNITY): Payer: Self-pay | Admitting: Pharmacy Technician

## 2018-12-08 ENCOUNTER — Other Ambulatory Visit: Payer: Self-pay

## 2018-12-08 ENCOUNTER — Inpatient Hospital Stay (HOSPITAL_COMMUNITY)
Admission: EM | Admit: 2018-12-08 | Discharge: 2018-12-10 | DRG: 394 | Disposition: A | Discharge status: Home / Self Care | Payer: Medicare Other | Attending: Internal Medicine | Admitting: Internal Medicine

## 2018-12-08 ENCOUNTER — Emergency Department (HOSPITAL_COMMUNITY): Payer: Medicare Other

## 2018-12-08 DIAGNOSIS — Z86010 Personal history of colonic polyps: Secondary | ICD-10-CM

## 2018-12-08 DIAGNOSIS — Z90710 Acquired absence of both cervix and uterus: Secondary | ICD-10-CM | POA: Diagnosis not present

## 2018-12-08 DIAGNOSIS — K219 Gastro-esophageal reflux disease without esophagitis: Secondary | ICD-10-CM | POA: Diagnosis not present

## 2018-12-08 DIAGNOSIS — Z79899 Other long term (current) drug therapy: Secondary | ICD-10-CM

## 2018-12-08 DIAGNOSIS — K55039 Acute (reversible) ischemia of large intestine, extent unspecified: Secondary | ICD-10-CM | POA: Diagnosis not present

## 2018-12-08 DIAGNOSIS — F329 Major depressive disorder, single episode, unspecified: Secondary | ICD-10-CM | POA: Diagnosis present

## 2018-12-08 DIAGNOSIS — Z96651 Presence of right artificial knee joint: Secondary | ICD-10-CM | POA: Diagnosis present

## 2018-12-08 DIAGNOSIS — Z7982 Long term (current) use of aspirin: Secondary | ICD-10-CM | POA: Diagnosis not present

## 2018-12-08 DIAGNOSIS — I10 Essential (primary) hypertension: Secondary | ICD-10-CM | POA: Diagnosis present

## 2018-12-08 DIAGNOSIS — F419 Anxiety disorder, unspecified: Secondary | ICD-10-CM | POA: Diagnosis present

## 2018-12-08 DIAGNOSIS — D696 Thrombocytopenia, unspecified: Secondary | ICD-10-CM | POA: Diagnosis present

## 2018-12-08 DIAGNOSIS — K529 Noninfective gastroenteritis and colitis, unspecified: Secondary | ICD-10-CM | POA: Diagnosis not present

## 2018-12-08 DIAGNOSIS — Z1159 Encounter for screening for other viral diseases: Secondary | ICD-10-CM | POA: Diagnosis not present

## 2018-12-08 DIAGNOSIS — F32A Depression, unspecified: Secondary | ICD-10-CM | POA: Diagnosis present

## 2018-12-08 DIAGNOSIS — K625 Hemorrhage of anus and rectum: Secondary | ICD-10-CM

## 2018-12-08 DIAGNOSIS — E876 Hypokalemia: Secondary | ICD-10-CM | POA: Diagnosis not present

## 2018-12-08 DIAGNOSIS — Z803 Family history of malignant neoplasm of breast: Secondary | ICD-10-CM

## 2018-12-08 LAB — COMPREHENSIVE METABOLIC PANEL
ALT: 13 U/L (ref 0–44)
AST: 27 U/L (ref 15–41)
Albumin: 4 g/dL (ref 3.5–5.0)
Alkaline Phosphatase: 65 U/L (ref 38–126)
Anion gap: 14 (ref 5–15)
BUN: 17 mg/dL (ref 8–23)
CO2: 20 mmol/L — ABNORMAL LOW (ref 22–32)
Calcium: 9.4 mg/dL (ref 8.9–10.3)
Chloride: 101 mmol/L (ref 98–111)
Creatinine, Ser: 0.97 mg/dL (ref 0.44–1.00)
GFR calc Af Amer: >60 mL/min (ref >60)
GFR calc non Af Amer: 55 mL/min — ABNORMAL LOW (ref >60)
Glucose, Bld: 131 mg/dL — ABNORMAL HIGH (ref 70–99)
Potassium: 3.6 mmol/L (ref 3.5–5.1)
Sodium: 135 mmol/L (ref 135–145)
Total Bilirubin: 0.7 mg/dL (ref 0.3–1.2)
Total Protein: 6.8 g/dL (ref 6.5–8.1)

## 2018-12-08 LAB — CBC WITH DIFFERENTIAL/PLATELET
Abs Immature Granulocytes: 0.04 10*3/uL (ref 0.00–0.07)
Abs Immature Granulocytes: 0.03 10*3/uL (ref 0.00–0.07)
Basophils Absolute: 0 10*3/uL (ref 0.0–0.1)
Basophils Absolute: 0 10*3/uL (ref 0.0–0.1)
Basophils Relative: 0 %
Basophils Relative: 0 %
Eosinophils Absolute: 0 10*3/uL (ref 0.0–0.5)
Eosinophils Absolute: 0 10*3/uL (ref 0.0–0.5)
Eosinophils Relative: 0 %
Eosinophils Relative: 0 %
HCT: 43.1 % (ref 36.0–46.0)
HCT: 40.5 % (ref 36.0–46.0)
Hemoglobin: 14.4 g/dL (ref 12.0–15.0)
Hemoglobin: 13.7 g/dL (ref 12.0–15.0)
Immature Granulocytes: 0 %
Immature Granulocytes: 0 %
Lymphocytes Relative: 12 %
Lymphocytes Relative: 17 %
Lymphs Abs: 1.2 10*3/uL (ref 0.7–4.0)
Lymphs Abs: 1.6 10*3/uL (ref 0.7–4.0)
MCH: 29.9 pg (ref 26.0–34.0)
MCH: 29.7 pg (ref 26.0–34.0)
MCHC: 33.4 g/dL (ref 30.0–36.0)
MCHC: 33.8 g/dL (ref 30.0–36.0)
MCV: 89.4 fL (ref 80.0–100.0)
MCV: 87.7 fL (ref 80.0–100.0)
Monocytes Absolute: 0.7 10*3/uL (ref 0.1–1.0)
Monocytes Absolute: 0.8 10*3/uL (ref 0.1–1.0)
Monocytes Relative: 8 %
Monocytes Relative: 9 %
Neutro Abs: 7.4 10*3/uL (ref 1.7–7.7)
Neutro Abs: 6.9 10*3/uL (ref 1.7–7.7)
Neutrophils Relative %: 80 %
Neutrophils Relative %: 74 %
Platelets: 168 10*3/uL (ref 150–400)
Platelets: 164 10*3/uL (ref 150–400)
RBC: 4.82 MIL/uL (ref 3.87–5.11)
RBC: 4.62 MIL/uL (ref 3.87–5.11)
RDW: 13.2 % (ref 11.5–15.5)
RDW: 13.2 % (ref 11.5–15.5)
WBC: 9.4 10*3/uL (ref 4.0–10.5)
WBC: 9.3 10*3/uL (ref 4.0–10.5)
nRBC: 0 % (ref 0.0–0.2)
nRBC: 0 % (ref 0.0–0.2)

## 2018-12-08 LAB — TYPE AND SCREEN
ABO/RH(D): O POS
Antibody Screen: NEGATIVE

## 2018-12-08 LAB — POC OCCULT BLOOD, ED
Fecal Occult Bld: NEGATIVE
Fecal Occult Bld: POSITIVE — AB

## 2018-12-08 LAB — ABO/RH: ABO/RH(D): O POS

## 2018-12-08 LAB — SARS CORONAVIRUS 2 BY RT PCR (HOSPITAL ORDER, PERFORMED IN ~~LOC~~ HOSPITAL LAB): SARS Coronavirus 2: NEGATIVE

## 2018-12-08 MED ORDER — IOHEXOL 300 MG/ML  SOLN
100.0000 mL | Freq: Once | INTRAMUSCULAR | Status: AC | PRN
  Administered 2018-12-08: 100 mL via INTRAVENOUS

## 2018-12-08 MED ORDER — ONDANSETRON HCL 4 MG PO TABS: 4.0000 mg | ORAL_TABLET | Freq: Four times a day (QID) | ORAL | Status: DC | PRN

## 2018-12-08 MED ORDER — ONDANSETRON HCL 4 MG/2ML IJ SOLN: 4.0000 mg | Freq: Four times a day (QID) | INTRAMUSCULAR | Status: DC | PRN

## 2018-12-08 MED ORDER — MORPHINE SULFATE (PF) 2 MG/ML IV SOLN: 1.0000 mg | INTRAVENOUS | Status: DC | PRN

## 2018-12-08 MED ORDER — SODIUM CHLORIDE 0.9 % IV SOLN
2.0000 g | INTRAVENOUS | Status: DC
  Administered 2018-12-08 – 2018-12-09 (×2): 2 g via INTRAVENOUS
  Filled 2018-12-08 (×2): qty 2
  Filled 2018-12-08: qty 20

## 2018-12-08 MED ORDER — SODIUM CHLORIDE 0.9 % IV SOLN
INTRAVENOUS | Status: DC
  Administered 2018-12-08 – 2018-12-09 (×2): via INTRAVENOUS

## 2018-12-08 MED ORDER — METRONIDAZOLE IN NACL 5-0.79 MG/ML-% IV SOLN
500.0000 mg | Freq: Three times a day (TID) | INTRAVENOUS | Status: DC
  Administered 2018-12-08 – 2018-12-10 (×5): 500 mg via INTRAVENOUS
  Filled 2018-12-08 (×5): qty 100

## 2018-12-08 MED ORDER — SODIUM CHLORIDE 0.9 % IV SOLN
1.0000 g | INTRAVENOUS | Status: DC
  Filled 2018-12-08: qty 10

## 2018-12-08 MED ORDER — PANTOPRAZOLE SODIUM 40 MG IV SOLR
40.0000 mg | Freq: Two times a day (BID) | INTRAVENOUS | Status: DC
  Administered 2018-12-09 – 2018-12-10 (×4): 40 mg via INTRAVENOUS
  Filled 2018-12-08 (×4): qty 40

## 2018-12-08 NOTE — ED Provider Notes (Addendum)
[facility] EMERGENCY DEPARTMENT Provider Note   CSN: [id number] Arrival date & time: 12/08/18  1420    History   Chief Complaint Chief Complaint  Patient presents with  . Abdominal Pain    HPI Ashley Solomon is a 84 y.o. female who presents to ED for bright red blood per rectum.  States that the symptoms began approximately 12 hours ago.  She is unable to quantify the number of times this has happened.  She reports blood around her stool.  States that the stool is formed and denies any diarrhea.  No history of similar symptoms in the past.  She reports lower abdominal discomfort.  She reports weakness when she has a bowel movement but denies weakness otherwise.  Denies any nausea, vomiting.  States that she did eat a rather large meal of stirfry yesterday and is unsure if this caused her symptoms.  She had a colonoscopy done 3 years ago and one polyp was removed.  She was told that due to her age she no longer needs colonoscopies.  She denies any urinary symptoms or sick contacts with similar symptoms.  Takes a baby aspirin daily. No known chronic GI issues that she is aware of.  Denies any lightheadedness, loss of consciousness, hemorrhoids, pain with bowel movements, shortness of breath.     HPI  Past Medical History:  Diagnosis Date  . Displaced fracture of distal end of left radius 07/07/2015  . Hyponatremia 10/31/2017  . Syncope 10/31/2017  . Syncope, vasovagal 10/31/2017    Patient Active Problem List   Diagnosis Date Noted  . Rectal bleed 12/08/2018  . Acute colitis 12/08/2018  . Benign essential HTN 12/08/2018  . GERD (gastroesophageal reflux disease) 12/08/2018  . Depression 12/08/2018  . Syncope, vasovagal 10/31/2017  . Hyponatremia 10/31/2017    Past Surgical History:  Procedure Laterality Date  . ABDOMINAL HYSTERECTOMY    . BREAST EXCISIONAL BIOPSY Left 2004  . ORIF WRIST FRACTURE Left 07/08/2015   Procedure: OPEN REDUCTION INTERNAL FIXATION  (ORIF) WRIST FRACTURE;  Surgeon: Roseanne Kaufman, MD;  Location: [facility];  Service: Orthopedics;  Laterality: Left;  . REPLACEMENT TOTAL KNEE Right      OB History   No obstetric history on file.      Home Medications    Prior to Admission medications   Medication Sig Start Date End Date Taking? Authorizing Provider  acetaminophen (TYLENOL) 500 MG tablet Take 500 mg by mouth every 6 (six) hours as needed for mild pain or headache.   Yes [provider]  amLODipine (NORVASC) 2.5 MG tablet Take 2.5 mg by mouth at bedtime.  10/22/18  Yes [provider]  CALCIUM-VITAMIN D PO Take 600-800 tablets by mouth daily. 800   Yes [provider]  Cholecalciferol (VITAMIN D PO) Take 1,000 mg by mouth daily.    Yes [provider]  DULoxetine (CYMBALTA) 30 MG capsule Take 30 mg by mouth daily. 06/22/15  Yes [provider]  famotidine (PEPCID) 20 MG tablet Take 20 mg by mouth at bedtime.  11/19/18  Yes [provider]  Fish Oil-Cholecalciferol (FISH OIL + D3 PO) Take 1,200 mg by mouth daily.    Yes [provider]  PRESCRIPTION MEDICATION as needed. 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   Yes [provider]  vitamin B-12 (CYANOCOBALAMIN) 1000 MCG tablet Take 1,000 mcg by mouth daily.    Yes  [provider]  vitamin C (ASCORBIC ACID) 500 MG tablet Take 500 mg by mouth daily.   Yes [provider]  vitamin E 400 UNIT capsule Take 400 mg by mouth daily.    Yes [provider]    Family History Family History  Problem Relation Age of Onset  . Breast cancer Paternal Aunt     Social History Social History   Tobacco Use  . Smoking status: Never Smoker  Substance Use Topics  . Alcohol use: Yes    Comment: seldom  . Drug use: No     Allergies   Patient has no known allergies.   Review of Systems Review of Systems  Constitutional:  Negative for appetite change, chills and fever.  HENT: Negative for ear pain, rhinorrhea, sneezing and sore throat.   Eyes: Negative for photophobia and visual disturbance.  Respiratory: Negative for cough, chest tightness, shortness of breath and wheezing.   Cardiovascular: Negative for chest pain and palpitations.  Gastrointestinal: Positive for abdominal pain and blood in stool. Negative for constipation, diarrhea, nausea and vomiting.  Genitourinary: Negative for dysuria, hematuria and urgency.  Musculoskeletal: Negative for myalgias.  Skin: Negative for rash.  Neurological: Negative for dizziness, weakness and light-headedness.     Physical Exam Updated Vital Signs BP (!) 143/85 (BP Location: Right Arm)   Pulse 83   Temp (!) 97.5 F (36.4 C) (Oral)   Resp (!) 23   Ht 5\' 6"  (1.676 m)   Wt 64.4 kg   SpO2 94%   BMI 22.92 kg/m   Physical Exam Vitals signs and nursing note reviewed. Exam conducted with a chaperone present.  Constitutional:      General: She is not in acute distress.    Appearance: She is well-developed.  HENT:     Head: Normocephalic and atraumatic.     Nose: Nose normal.  Eyes:     General: No scleral icterus.       Left eye: No discharge.     Conjunctiva/sclera: Conjunctivae normal.  Neck:     Musculoskeletal: Normal range of motion and neck supple.  Cardiovascular:     Rate and Rhythm: Normal rate and regular rhythm.     Heart sounds: Normal heart sounds. No murmur. No friction rub. No gallop.   Pulmonary:     Effort: Pulmonary effort is normal. No respiratory distress.     Breath sounds: Normal breath sounds.  Abdominal:     General: Bowel sounds are normal. There is no distension.     Palpations: Abdomen is soft.     Tenderness: There is no abdominal tenderness. There is no guarding.  Genitourinary:    Rectum: Guaiac result positive. No tenderness or external hemorrhoid.     Comments: Stool grossly hemoccult positive. Musculoskeletal: Normal  range of motion.  Skin:    General: Skin is warm and dry.     Findings: No rash.  Neurological:     Mental Status: She is alert.     Motor: No abnormal muscle tone.     Coordination: Coordination normal.      ED Treatments / Results  Labs (all labs ordered are listed, but only abnormal results are displayed) Labs Reviewed  COMPREHENSIVE METABOLIC PANEL - Abnormal; Notable for the following components:      Result Value   CO2 20 (*)    Glucose, Bld 131 (*)    GFR calc non Af Amer 55 (*)    All other components within normal limits  POC OCCULT BLOOD, ED - Abnormal; Notable for the following components:   Fecal Occult Bld POSITIVE (*)    All other components within normal limits  SARS CORONAVIRUS 2 (HOSPITAL ORDER, [place] [facility])  CBC WITH DIFFERENTIAL/PLATELET  POC OCCULT BLOOD, ED  TYPE AND SCREEN  ABO/RH    EKG None  Radiology Ct Abdomen Pelvis W Contrast  Result Date: 12/08/2018 CLINICAL DATA:  Abdominal pain. Suspected diverticulitis. Rectal bleeding. EXAM: CT ABDOMEN AND PELVIS WITH CONTRAST TECHNIQUE: Multidetector CT imaging of the abdomen and pelvis was performed using the standard protocol following bolus administration of intravenous contrast. CONTRAST:  129mL OMNIPAQUE IOHEXOL 300 MG/ML  SOLN COMPARISON:  None. FINDINGS: Lower chest: Linear markings at both lung bases more likely to represent scarring than atelectasis. No consolidation. No pleural effusion. Hepatobiliary: Liver parenchyma is normal.  No calcified gallstones. Pancreas: Normal Spleen: Normal Adrenals/Urinary Tract: Adrenal glands are normal. Renal parenchyma is normal. Bladder is normal. Stomach/Bowel: Wall thickening throughout the left colon in the descending colon segment. This could be due to infectious or ischemic colitis. Patient has diverticulosis but there is no evidence of infectious diverticulitis. Transverse colon and right colon are normal, with a mobile mesentery.  Vascular/Lymphatic: Aortic atherosclerosis. No aneurysm. IVC is normal. No retroperitoneal adenopathy. Reproductive: Previous hysterectomy.  No pelvic mass. Other: No free fluid or air. Musculoskeletal: Chronic lumbar degenerative changes. IMPRESSION: Wall thickening throughout the descending colon which could be due to infectious colitis or ischemic colitis. No evidence of perforation or abscess. The patient does have diverticulosis but there is no evidence of ordinary diverticulitis. Electronically Signed   By: Nelson Chimes M.D.   On: 12/08/2018 17:08    Procedures Procedures (including critical care time)  Medications Ordered in ED Medications  iohexol (OMNIPAQUE) 300 MG/ML solution 100 mL (100 mLs Intravenous Contrast Given 12/08/18 1642)     Initial Impression / Assessment and Plan / ED Course  I have reviewed the triage vital signs and the nursing notes.  Pertinent labs & imaging results that were available during my care of the patient were reviewed by me and considered in my medical decision making (see chart for details).        84 year old female presents to ED for bright red blood per rectum since this morning.  Reports blood around her stool.  Reports having normal consistency of bowel movements.  She is unsure if this is related to a large meal that she ate last night.  No history of similar symptoms in the past.  Denies any vomiting.  Takes a baby aspirin daily.  On exam abdomen is tender in the lower quadrants without rebound or guarding.  She appears overall comfortable, hemodynamically stable.  No CVA tenderness.  Hemoccult is negative.  CBC with normal hemoglobin hematocrit as well as other values.  CMP unremarkable.  CT of the abdomen pelvis shows infectious versus ischemic colitis.  Patient is in no acute distress, she has no risk factors for ischemic colitis, EKG shows normal sinus rhythm without evidence of A. Fib.  When patient stood up, there was a large amount of bright  red blood in the sheets, as well as in the bedpan.  I did a repeat rectal exam and results were positive.  Due to her history of diverticulosis, CT findings I feel it is reasonable to admit patient for overnight observation due to her large amount of bleeding and potential for diverticular bleed. Will ask hospitalist to admit.  I have consulted  Dr. Therisa Doyne of gastroenterology who will evaluate patient in consult.    Final Clinical Impressions(s) / ED Diagnoses   Final diagnoses:  Colitis  Rectal bleeding    ED Discharge Orders    None         Delia Heady, PA-C 12/08/18 2038    Gareth Morgan, MD 12/10/18 [phone]

## 2018-12-08 NOTE — ED Provider Notes (Signed)
MSE was initiated and I personally evaluated the patient and placed orders (if any) at  2:51 PM on December 08, 2018.  Patient states that 6 AM this morning she has been having bright red blood per rectum whenever she has a bowel movement.  The blood is both within and around her stool.  While having bowel movements, she feels weak, but no weakness other times.  No infectious symptoms such as fevers, chills, nausea, vomiting.  She was feeling well yesterday.  She denies blood thinners, takes aspirin daily.   Gen: appears anxious, but nontoxic Abd: soft without rigidity, guarding, distention. Pt reports generalized discomfort with palpation.  GU: chaperone present. No gross blood on exam. No obvious hemorrhoids.   The patient appears stable so that the remainder of the MSE may be completed by another provider.   Franchot Heidelberg, PA-C 12/08/18 1453    Margette Fast, MD 12/09/18 [id number]

## 2018-12-08 NOTE — ED Triage Notes (Signed)
Pt arrives via ems from home with reports of bright red blood in her stool with generalized abd tenderness onset today. 130/82,  HR 82, 18 RR, 96%, 99.56F. denies sick contacts. Negative for orthostatic changes.

## 2018-12-08 NOTE — ED Notes (Signed)
When pt stood up to put on her pants she passed blood in her brief. ED PA made aware.

## 2018-12-08 NOTE — H&P (Signed)
History and Physical   [name] ACZ:[id number] DOB: 1934-08-27 DOA: 12/08/2018  Referring MD/NP/PA: Dr. Billy Fischer  PCP: [name], MD   Outpatient Specialists: Dr. Michail Sermon gastroenterology  Patient coming from: Home  Chief Complaint: Abdominal pain  HPI: Ashley Solomon is a 84 y.o. female with medical history significant of hyponatremia and syncope, hypertension, once anxiety disorder, GERD, who presented to the ER with sudden onset of diffuse abdominal pain and nausea.  Denied any diarrhea denied any hematemesis or melena.  Patient was evaluated in the ER and initially was seen to have negative stool guaiac.  When patient got out of bed suddenly she had bright red blood per rectum.  She is guaiac positive now x2.  Patient reported no prior GI bleed.  She was evaluated with a CT abdomen and pelvis that showed significant colitis involving descending colon.  Patient is subsequently being admitted to the hospital for work-up.  She has some pain at the left lower quadrant that is vague rated as 5 out of 10.  No prior history of diverticulosis and no prior history of colitis.  Patient denied any sick contacts.  She is not eating outside the home.  Patient reported having her colonoscopy 2 years ago with polyps removed.  No diverticulosis at the time..  ED Course: Temperature 97.5, blood pressure 114/76, pulse 83 respirate of 23 oxygen sat 93% room air.  CBC is within normal.  Glucose 131.  Otherwise chemistry appears to be within normal.  CT abdomen pelvis showed wall thickening throughout the descending colon consistent with infectious or ischemic colitis.  Patient is being admitted for work-up.  Review of Systems: As per HPI otherwise 10 point review of systems negative.    Past Medical History:  Diagnosis Date   Displaced fracture of distal end of left radius 07/07/2015   Hyponatremia 10/31/2017   Syncope 10/31/2017   Syncope, vasovagal 10/31/2017    Past Surgical History:    Procedure Laterality Date   ABDOMINAL HYSTERECTOMY     BREAST EXCISIONAL BIOPSY Left 2004   ORIF WRIST FRACTURE Left 07/08/2015   Procedure: OPEN REDUCTION INTERNAL FIXATION (ORIF) WRIST FRACTURE;  Surgeon: Roseanne Kaufman, MD;  Location: [facility];  Service: Orthopedics;  Laterality: Left;   REPLACEMENT TOTAL KNEE Right      reports that she has never smoked. She does not have any smokeless tobacco history on file. She reports current alcohol use. She reports that she does not use drugs.  No Known Allergies  Family History  Problem Relation Age of Onset   Breast cancer Paternal Aunt      Prior to Admission medications   Medication Sig Start Date End Date Taking? Authorizing Provider  acetaminophen (TYLENOL) 500 MG tablet Take 500 mg by mouth every 6 (six) hours as needed for mild pain or headache.   Yes [provider]  amLODipine (NORVASC) 2.5 MG tablet Take 2.5 mg by mouth at bedtime.  10/22/18  Yes [provider]  CALCIUM-VITAMIN D PO Take 600-800 tablets by mouth daily. 800   Yes [provider]  Cholecalciferol (VITAMIN D PO) Take 1,000 mg by mouth daily.    Yes [provider]  DULoxetine (CYMBALTA) 30 MG capsule Take 30 mg by mouth daily. 06/22/15  Yes [provider]  famotidine (PEPCID) 20 MG tablet Take 20 mg by mouth at bedtime.  11/19/18  Yes [provider]  Fish Oil-Cholecalciferol (FISH OIL + D3 PO) Take 1,200 mg by mouth daily.    Yes  [provider]  PRESCRIPTION MEDICATION as needed. 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   Yes [provider]  vitamin B-12 (CYANOCOBALAMIN) 1000 MCG tablet Take 1,000 mcg by mouth daily.    Yes [provider]  vitamin C (ASCORBIC ACID) 500 MG tablet Take 500 mg by mouth daily.   Yes [provider]  vitamin E 400 UNIT capsule Take 400 mg by mouth daily.    Yes [provider]    Physical Exam: Vitals:   12/08/18 1515 12/08/18 1530 12/08/18 1600 12/08/18 1833  BP: (!) 143/84 (!) 146/67 (!) 145/73 (!) 143/85  Pulse: 82 73 72 83  Resp: 18 14 13  (!) 23  Temp:      TempSrc:      SpO2: 98% 95% 94% 94%  Weight:      Height:          Constitutional: NAD, calm, uncomfortable Vitals:   12/08/18 1515 12/08/18 1530 12/08/18 1600 12/08/18 1833  BP: (!) 143/84 (!) 146/67 (!) 145/73 (!) 143/85  Pulse: 82 73 72 83  Resp: 18 14 13  (!) 23  Temp:      TempSrc:      SpO2: 98% 95% 94% 94%  Weight:      Height:       Eyes: PERRL, lids and conjunctivae normal ENMT: Mucous membranes are moist. Posterior pharynx clear of any exudate or lesions.Normal dentition.  Neck: normal, supple, no masses, no thyromegaly Respiratory: clear to auscultation bilaterally, no wheezing, no crackles. Normal respiratory effort. No accessory muscle use.  Cardiovascular: Regular rate and rhythm, no murmurs / rubs / gallops. No extremity edema. 2+ pedal pulses. No carotid bruits.  Abdomen: Left lower quadrant abdominal tenderness no masses palpated. No hepatosplenomegaly. Bowel sounds positive.  Musculoskeletal: no clubbing / cyanosis. No joint deformity upper and lower extremities. Good ROM, no contractures. Normal muscle tone.  Skin: no rashes, lesions, ulcers. No induration Neurologic: CN 2-12 grossly intact. Sensation intact, DTR normal. Strength 5/5 in all 4.  Psychiatric: Normal judgment and insight. Alert and oriented x 3. Normal mood.     Labs on Admission: I have personally reviewed following labs and imaging studies  CBC: Recent Labs  Lab 12/08/18 1434  WBC 9.4  NEUTROABS 7.4  HGB 14.4  HCT 43.1  MCV 89.4  PLT 332   Basic Metabolic Panel: Recent Labs  Lab 12/08/18 1434  NA 135  K 3.6  CL 101  CO2 20*  GLUCOSE 131*  BUN 17  CREATININE 0.97  CALCIUM 9.4   GFR: Estimated Creatinine Clearance: 42.6 mL/min (by C-G formula based on SCr of 0.97 mg/dL). Liver  Function Tests: Recent Labs  Lab 12/08/18 1434  AST 27  ALT 13  ALKPHOS 65  BILITOT 0.7  PROT 6.8  ALBUMIN 4.0   No results for input(s): LIPASE, AMYLASE in the last 168 hours. No results for input(s): AMMONIA in the last 168 hours. Coagulation Profile: No results for input(s): INR, PROTIME in the last 168 hours. Cardiac Enzymes: No results for input(s): CKTOTAL, CKMB, CKMBINDEX, TROPONINI in the last 168 hours. BNP (last 3 results) No results for input(s): PROBNP in the last 8760 hours. HbA1C: No results for input(s): HGBA1C in the last 72 hours. CBG: No results for input(s): GLUCAP in the last 168 hours. Lipid Profile: No results for input(s): CHOL, HDL, LDLCALC, TRIG, CHOLHDL, LDLDIRECT in the last 72 hours. Thyroid  Function Tests: No results for input(s): TSH, T4TOTAL, FREET4, T3FREE, THYROIDAB in the last 72 hours. Anemia Panel: No results for input(s): VITAMINB12, FOLATE, FERRITIN, TIBC, IRON, RETICCTPCT in the last 72 hours. Urine analysis: No results found for: COLORURINE, APPEARANCEUR, LABSPEC, PHURINE, GLUCOSEU, HGBUR, BILIRUBINUR, KETONESUR, PROTEINUR, UROBILINOGEN, NITRITE, LEUKOCYTESUR Sepsis Labs: @LABRCNTIP (procalcitonin:4,lacticidven:4) )No results found for this or any previous visit (from the past 240 hour(s)).   Radiological Exams on Admission: Ct Abdomen Pelvis W Contrast  Result Date: 12/08/2018 CLINICAL DATA:  Abdominal pain. Suspected diverticulitis. Rectal bleeding. EXAM: CT ABDOMEN AND PELVIS WITH CONTRAST TECHNIQUE: Multidetector CT imaging of the abdomen and pelvis was performed using the standard protocol following bolus administration of intravenous contrast. CONTRAST:  182mL OMNIPAQUE IOHEXOL 300 MG/ML  SOLN COMPARISON:  None. FINDINGS: Lower chest: Linear markings at both lung bases more likely to represent scarring than atelectasis. No consolidation. No pleural effusion. Hepatobiliary: Liver parenchyma is normal.  No calcified gallstones.  Pancreas: Normal Spleen: Normal Adrenals/Urinary Tract: Adrenal glands are normal. Renal parenchyma is normal. Bladder is normal. Stomach/Bowel: Wall thickening throughout the left colon in the descending colon segment. This could be due to infectious or ischemic colitis. Patient has diverticulosis but there is no evidence of infectious diverticulitis. Transverse colon and right colon are normal, with a mobile mesentery. Vascular/Lymphatic: Aortic atherosclerosis. No aneurysm. IVC is normal. No retroperitoneal adenopathy. Reproductive: Previous hysterectomy.  No pelvic mass. Other: No free fluid or air. Musculoskeletal: Chronic lumbar degenerative changes. IMPRESSION: Wall thickening throughout the descending colon which could be due to infectious colitis or ischemic colitis. No evidence of perforation or abscess. The patient does have diverticulosis but there is no evidence of ordinary diverticulitis. Electronically Signed   By: Nelson Chimes M.D.   On: 12/08/2018 17:08      Assessment/Plan Principal Problem:   Rectal bleed Active Problems:   Acute colitis   Benign essential HTN   GERD (gastroesophageal reflux disease)   Depression     #1 acute descending colitis: Most likely cause of her rectal bleed.  Patient will be admitted and treated with IV Rocephin and Flagyl.  Supportive care.  GI will be consulted.  IV Protonix.  So far hemoglobin is stable but patient just had her bleed so is likely to drop.  We will type and crossmatch and transfuse if indicated.  Serial CBC.  #2 rectal bleed: Continue as above.  #3 hypertension: Continue blood pressure control.  #4 GERD: Continue with PPIs  #5 depression with anxiety: Continue home regimen.       DVT prophylaxis: SCD Code Status: Full code Family Communication: Discussed care with the patient Disposition Plan: To be determined Consults called: [facility] Admission status: Inpatient  Severity of Illness: The appropriate  patient status for this patient is INPATIENT. Inpatient status is judged to be reasonable and necessary in order to provide the required intensity of service to ensure the patient's safety. The patient's presenting symptoms, physical exam findings, and initial radiographic and laboratory data in the context of their chronic comorbidities is felt to place them at high risk for further clinical deterioration. Furthermore, it is not anticipated that the patient will be medically stable for discharge from the hospital within 2 midnights of admission. The following factors support the patient status of inpatient.   " The patient's presenting symptoms include abdominal pain. " The worrisome physical exam findings include tenderness in the left lower quadrant. " The initial radiographic and laboratory data are worrisome because of evidence of colitis. "  The chronic co-morbidities include hypertension with GERD.   * I certify that at the point of admission it is my clinical judgment that the patient will require inpatient hospital care spanning beyond 2 midnights from the point of admission due to high intensity of service, high risk for further deterioration and high frequency of surveillance required.Barbette Merino MD [facility] Pager [phone]  If 7PM-7AM, please contact night-coverage [email or web address] Password [facility]  12/08/2018, 8:28 PM

## 2018-12-09 ENCOUNTER — Ambulatory Visit: Payer: Federal, State, Local not specified - PPO

## 2018-12-09 DIAGNOSIS — E876 Hypokalemia: Secondary | ICD-10-CM

## 2018-12-09 DIAGNOSIS — K529 Noninfective gastroenteritis and colitis, unspecified: Secondary | ICD-10-CM

## 2018-12-09 DIAGNOSIS — I10 Essential (primary) hypertension: Secondary | ICD-10-CM

## 2018-12-09 DIAGNOSIS — F329 Major depressive disorder, single episode, unspecified: Secondary | ICD-10-CM

## 2018-12-09 DIAGNOSIS — K219 Gastro-esophageal reflux disease without esophagitis: Secondary | ICD-10-CM

## 2018-12-09 LAB — CBC WITH DIFFERENTIAL/PLATELET
Abs Immature Granulocytes: 0.02 10*3/uL (ref 0.00–0.07)
Basophils Absolute: 0 10*3/uL (ref 0.0–0.1)
Basophils Relative: 1 %
Eosinophils Absolute: 0.1 10*3/uL (ref 0.0–0.5)
Eosinophils Relative: 1 %
HCT: 38.9 % (ref 36.0–46.0)
Hemoglobin: 13 g/dL (ref 12.0–15.0)
Immature Granulocytes: 0 %
Lymphocytes Relative: 24 %
Lymphs Abs: 1.7 10*3/uL (ref 0.7–4.0)
MCH: 29.9 pg (ref 26.0–34.0)
MCHC: 33.4 g/dL (ref 30.0–36.0)
MCV: 89.4 fL (ref 80.0–100.0)
Monocytes Absolute: 0.6 10*3/uL (ref 0.1–1.0)
Monocytes Relative: 9 %
Neutro Abs: 4.7 10*3/uL (ref 1.7–7.7)
Neutrophils Relative %: 65 %
Platelets: 152 10*3/uL (ref 150–400)
RBC: 4.35 MIL/uL (ref 3.87–5.11)
RDW: 13.2 % (ref 11.5–15.5)
WBC: 7.1 10*3/uL (ref 4.0–10.5)
nRBC: 0 % (ref 0.0–0.2)

## 2018-12-09 LAB — COMPREHENSIVE METABOLIC PANEL
ALT: 11 U/L (ref 0–44)
AST: 21 U/L (ref 15–41)
Albumin: 3.3 g/dL — ABNORMAL LOW (ref 3.5–5.0)
Alkaline Phosphatase: 55 U/L (ref 38–126)
Anion gap: 10 (ref 5–15)
BUN: 10 mg/dL (ref 8–23)
CO2: 24 mmol/L (ref 22–32)
Calcium: 8.2 mg/dL — ABNORMAL LOW (ref 8.9–10.3)
Chloride: 103 mmol/L (ref 98–111)
Creatinine, Ser: 0.92 mg/dL (ref 0.44–1.00)
GFR calc Af Amer: >60 mL/min (ref >60)
GFR calc non Af Amer: 58 mL/min — ABNORMAL LOW (ref >60)
Glucose, Bld: 121 mg/dL — ABNORMAL HIGH (ref 70–99)
Potassium: 3.4 mmol/L — ABNORMAL LOW (ref 3.5–5.1)
Sodium: 137 mmol/L (ref 135–145)
Total Bilirubin: 1 mg/dL (ref 0.3–1.2)
Total Protein: 5.7 g/dL — ABNORMAL LOW (ref 6.5–8.1)

## 2018-12-09 MED ORDER — SODIUM CHLORIDE 0.9 % IV SOLN
INTRAVENOUS | Status: AC
  Administered 2018-12-10: 03:00:00 via INTRAVENOUS

## 2018-12-09 MED ORDER — POTASSIUM CHLORIDE CRYS ER 20 MEQ PO TBCR
40.0000 meq | EXTENDED_RELEASE_TABLET | Freq: Once | ORAL | Status: AC
  Administered 2018-12-09: 40 meq via ORAL
  Filled 2018-12-09: qty 2

## 2018-12-09 MED ORDER — ACETAMINOPHEN 325 MG PO TABS: 650.0000 mg | ORAL_TABLET | Freq: Four times a day (QID) | ORAL | Status: DC | PRN

## 2018-12-09 NOTE — Consult Note (Signed)
Subjective:   HPI  The patient is a 84 year old female who presented to the emergency room with complaints of sudden onset of diffuse abdominal pain and nausea.  She was going to go home from the emergency room but then when she got up she passed bright red blood per rectum.  A CT scan of the abdomen was done and showed evidence of colitis in the descending colon.  The etiology could be infectious or ischemic.  The patient was not experiencing diarrhea.  She in fact has had more problems with constipation.  She did have some diverticula present but no evidence of diverticulitis.  Her last colonoscopy was 2 or 3 years ago by Dr. Michail Sermon.  She otherwise feels fine right now.  She does have some discomfort on the left side of her abdomen.  Review of Systems No chest pain or shortness of breath  Past Medical History:  Diagnosis Date  . Displaced fracture of distal end of left radius 07/07/2015  . Hyponatremia 10/31/2017  . Syncope 10/31/2017  . Syncope, vasovagal 10/31/2017   Past Surgical History:  Procedure Laterality Date  . ABDOMINAL HYSTERECTOMY    . BREAST EXCISIONAL BIOPSY Left 2004  . ORIF WRIST FRACTURE Left 07/08/2015   Procedure: OPEN REDUCTION INTERNAL FIXATION (ORIF) WRIST FRACTURE;  Surgeon: Roseanne Kaufman, MD;  Location: [facility];  Service: Orthopedics;  Laterality: Left;  . REPLACEMENT TOTAL KNEE Right    Social History   Socioeconomic History  . Marital status: Married    Spouse name: Not on file  . Number of children: Not on file  . Years of education: Not on file  . Highest education level: Not on file  Occupational History  . Not on file  Social Needs  . Financial resource strain: Not on file  . Food insecurity    Worry: Not on file    Inability: Not on file  . Transportation needs    Medical: Not on file    Non-medical: Not on file  Tobacco Use  . Smoking status: Never Smoker  Substance and Sexual Activity  . Alcohol use: Yes    Comment: seldom  . Drug use: No  .  Sexual activity: Not on file  Lifestyle  . Physical activity    Days per week: Not on file    Minutes per session: Not on file  . Stress: Not on file  Relationships  . Social Herbalist on phone: Not on file    Gets together: Not on file    Attends religious service: Not on file    Active member of club or organization: Not on file    Attends meetings of clubs or organizations: Not on file    Relationship status: Not on file  . Intimate partner violence    Fear of current or ex partner: Not on file    Emotionally abused: Not on file    Physically abused: Not on file    Forced sexual activity: Not on file  Other Topics Concern  . Not on file  Social History Narrative  . Not on file   family history includes Breast cancer in her paternal aunt.  Current Facility-Administered Medications:  .  0.9 %  sodium chloride infusion, , Intravenous, Continuous, Hongalgi, Anand D, MD .  acetaminophen (TYLENOL) tablet 650 mg, 650 mg, Oral, Q6H PRN, Hongalgi, Anand D, MD .  cefTRIAXone (ROCEPHIN) 2 g in sodium chloride 0.9 % 100 mL IVPB, 2 g, Intravenous, Q24H, Garba,  Henderson Newcomer, MD, Last Rate: 200 mL/hr at 12/08/18 2235, 2 g at 12/08/18 2235 .  metroNIDAZOLE (FLAGYL) IVPB 500 mg, 500 mg, Intravenous, Q8H, Garba, Mohammad L, MD, Last Rate: 100 mL/hr at 12/09/18 0559, 500 mg at 12/09/18 0559 .  morphine 2 MG/ML injection 1 mg, 1 mg, Intravenous, Q4H PRN, Garba, Mohammad L, MD .  ondansetron (ZOFRAN) tablet 4 mg, 4 mg, Oral, Q6H PRN **OR** ondansetron (ZOFRAN) injection 4 mg, 4 mg, Intravenous, Q6H PRN, Jonelle Sidle, Mohammad L, MD .  pantoprazole (PROTONIX) injection 40 mg, 40 mg, Intravenous, Q12H, Jonelle Sidle, Mohammad L, MD, 40 mg at 12/09/18 0939 No Known Allergies   Objective:     BP 139/78 (BP Location: Right Arm)   Pulse 79   Temp 98.1 F (36.7 C) (Oral)   Resp 18   Ht 5\' 6"  (1.676 m)   Wt 64.2 kg   SpO2 98%   BMI 22.84 kg/m   No distress  Heart regular rhythm no murmurs  Lungs  clear  Abdomen: Bowel sounds present, soft, slight tenderness in the left side of the abdomen but no rebound or guarding  Laboratory No components found for: D1    Assessment:     Suspected ischemic colitis as the cause of her abdominal pain and bleeding      Plan:     Supportive care.  I do not think she needs a colonoscopy.  I think we can advance her diet slowly.

## 2018-12-09 NOTE — Progress Notes (Signed)
Pt. had an episode of one rectal bleed when she went to the bathroom to urinate. She called this RN to checked. Its a red blood, not a lot. Asked how she is feeling. Pt.  verbalized she is doing okay.  MD Hongalgi made aware. Will continue to monitor.

## 2018-12-09 NOTE — Progress Notes (Signed)
PROGRESS NOTE   Ashley Solomon  JSH:[id number]    DOB: [birth year]    DOA: 12/08/2018  PCP: [name], MD   I have briefly reviewed patients previous medical records in [facility].  Chief Complaint  Patient presents with   Abdominal Pain    Brief Narrative:  84 year old widowed female, lives alone and is independent, PMH of HTN, GERD, hyponatremia, syncope, presented to ED with complaints of diarrhea,?  Abdominal pain and bright red bleeding per rectum.  CT abdomen showed descending colitis.  [facility] consulted.   Assessment & Plan:   Principal Problem:   Acute colitis Active Problems:   Rectal bleed   Benign essential HTN   GERD (gastroesophageal reflux disease)   Depression   Acute descending colitis/bright red bleeding per rectum  Infectious (seems less likely) versus ischemic (possibly)  Patient reports approximately 46-month history of intermittent constipation but night prior to admission, had several soft BMs eventually followed by bright red blood in stools without significant abdominal pain, nausea, vomiting, fever or chills.  CT abdomen shows descending colitis.  Does not seem infectious in the absence of fever, leukocytosis, abdominal pain or tenderness.  Empirically on IV ceftriaxone and metronidazole started on admission.  Also on clear liquid diet, advance if GI agrees.  Last colonoscopy and non-benign polypectomy reportedly in 2017.  [facility] consulted, I discussed with Dr. [name] who will see her today but likely no procedures and treat conservatively.  Monitor closely for worsening rectal bleeding.  Hemoglobin stable.  Essential hypertension  Mildly uncontrolled.  Continue amlodipine. Avoid hypotension due to concern for ischemic colitis.  GERD  Continue PPI.  Rectal bleeding does not seem like upper GI bleed.  Depression  Stable.  Continue Cymbalta.  Hypokalemia  Replace and follow    DVT prophylaxis: SCD's Code Status:  Full Family Communication: None at bedside Disposition: DC home pending clinical improvement, hopefully 7/17   Consultants:  [facility]-pending  Procedures:  None  Antimicrobials:  IV ceftriaxone and Flagyl 7/15 >   Subjective: History as noted above.  She had not had any further bleeding since ED until a short while ago when RN paged that she had a episode of rectal bleeding, mild.  Patient denies abdominal pain, nausea or vomiting.  No history of smoking.  Denies history of CAD or PAD.   Objective:  Vitals:   12/08/18 2148 12/08/18 2254 12/09/18 0435 12/09/18 0755  BP: (!) 175/81 (!) 156/75 137/76 139/78  Pulse: 75 73 72 79  Resp: 20  16 18   Temp: 98.2 F (36.8 C)  98 F (36.7 C) 98.1 F (36.7 C)  TempSrc: Oral  Oral Oral  SpO2: 96%  96% 98%  Weight: 64.2 kg     Height: 5\' 6"  (1.676 m)       Examination:  General exam: Pleasant elderly female, moderately built and nourished lying comfortably propped up in bed without distress.  Oral mucosa moist. Respiratory system: Clear to auscultation. Respiratory effort normal. Cardiovascular system: S1 & S2 heard, RRR. No JVD, murmurs, rubs, gallops or clicks. No pedal edema. Gastrointestinal system: Abdomen is nondistended, soft and nontender. No organomegaly or masses felt. Normal bowel sounds heard.  Telemetry personally reviewed: Sinus rhythm. Central nervous system: Alert and oriented. No focal neurological deficits. Extremities: Symmetric 5 x 5 power. Skin: No rashes, lesions or ulcers Psychiatry: Judgement and insight appear normal. Mood & affect appropriate.     Data Reviewed: I have personally reviewed following labs and imaging studies  CBC: Recent Labs  Lab 12/08/18 1434 12/08/18 2215 12/09/18 0408  WBC 9.4 9.3 7.1  NEUTROABS 7.4 6.9 4.7  HGB 14.4 13.7 13.0  HCT 43.1 40.5 38.9  MCV 89.4 87.7 89.4  PLT 168 164 017   Basic Metabolic Panel: Recent Labs  Lab 12/08/18 1434 12/09/18 0408  NA 135 137  K  3.6 3.4*  CL 101 103  CO2 20* 24  GLUCOSE 131* 121*  BUN 17 10  CREATININE 0.97 0.92  CALCIUM 9.4 8.2*   Liver Function Tests: Recent Labs  Lab 12/08/18 1434 12/09/18 0408  AST 27 21  ALT 13 11  ALKPHOS 65 55  BILITOT 0.7 1.0  PROT 6.8 5.7*  ALBUMIN 4.0 3.3*     Recent Results (from the past 240 hour(s))  SARS Coronavirus 2 (CEPHEID - Performed in [place] hospital lab), Hosp Order     Status: None   Collection Time: 12/08/18  9:00 PM   Specimen: Nasopharyngeal Swab  Result Value Ref Range Status   SARS Coronavirus 2 NEGATIVE NEGATIVE Final    Comment: (NOTE) If result is NEGATIVE SARS-CoV-2 target nucleic acids are NOT DETECTED. The SARS-CoV-2 RNA is generally detectable in upper and lower  respiratory specimens during the acute phase of infection. The lowest  concentration of SARS-CoV-2 viral copies this assay can detect is 250  copies / mL. A negative result does not preclude SARS-CoV-2 infection  and should not be used as the sole basis for treatment or other  patient management decisions.  A negative result may occur with  improper specimen collection / handling, submission of specimen other  than nasopharyngeal swab, presence of viral mutation(s) within the  areas targeted by this assay, and inadequate number of viral copies  (<250 copies / mL). A negative result must be combined with clinical  observations, patient history, and epidemiological information. If result is POSITIVE SARS-CoV-2 target nucleic acids are DETECTED. The SARS-CoV-2 RNA is generally detectable in upper and lower  respiratory specimens dur ing the acute phase of infection.  Positive  results are indicative of active infection with SARS-CoV-2.  Clinical  correlation with patient history and other diagnostic information is  necessary to determine patient infection status.  Positive results do  not rule out bacterial infection or co-infection with other viruses. If result is PRESUMPTIVE  POSTIVE SARS-CoV-2 nucleic acids MAY BE PRESENT.   A presumptive positive result was obtained on the submitted specimen  and confirmed on repeat testing.  While 2019 novel coronavirus  (SARS-CoV-2) nucleic acids may be present in the submitted sample  additional confirmatory testing may be necessary for epidemiological  and / or clinical management purposes  to differentiate between  SARS-CoV-2 and other Sarbecovirus currently known to infect humans.  If clinically indicated additional testing with an alternate test  methodology [phone]) is advised. The SARS-CoV-2 RNA is generally  detectable in upper and lower respiratory sp ecimens during the acute  phase of infection. The expected result is Negative. Fact Sheet for Patients:  StrictlyIdeas.no Fact Sheet for Healthcare Providers: BankingDealers.co.za This test is not yet approved or cleared by the [place] FDA and has been authorized for detection and/or diagnosis of SARS-CoV-2 by FDA under an Emergency Use Authorization (EUA).  This EUA will remain in effect (meaning this test can be used) for the duration of the COVID-19 declaration under Section 564(b)(1) of the Act, 21 U.S.C. section 360bbb-3(b)(1), unless the authorization is terminated or revoked sooner. Performed at [facility], [address] [place], [place] [address]          Radiology Studies: Ct Abdomen Pelvis W Contrast  Result Date: 12/08/2018 CLINICAL DATA:  Abdominal pain. Suspected diverticulitis. Rectal bleeding. EXAM: CT ABDOMEN AND PELVIS WITH CONTRAST TECHNIQUE: Multidetector CT imaging of the abdomen and pelvis was performed using the standard protocol following bolus administration of intravenous contrast. CONTRAST:  173mL OMNIPAQUE IOHEXOL 300 MG/ML  SOLN COMPARISON:  None. FINDINGS: Lower chest: Linear markings at both lung bases more likely to represent scarring than atelectasis. No  consolidation. No pleural effusion. Hepatobiliary: Liver parenchyma is normal.  No calcified gallstones. Pancreas: Normal Spleen: Normal Adrenals/Urinary Tract: Adrenal glands are normal. Renal parenchyma is normal. Bladder is normal. Stomach/Bowel: Wall thickening throughout the left colon in the descending colon segment. This could be due to infectious or ischemic colitis. Patient has diverticulosis but there is no evidence of infectious diverticulitis. Transverse colon and right colon are normal, with a mobile mesentery. Vascular/Lymphatic: Aortic atherosclerosis. No aneurysm. IVC is normal. No retroperitoneal adenopathy. Reproductive: Previous hysterectomy.  No pelvic mass. Other: No free fluid or air. Musculoskeletal: Chronic lumbar degenerative changes. IMPRESSION: Wall thickening throughout the descending colon which could be due to infectious colitis or ischemic colitis. No evidence of perforation or abscess. The patient does have diverticulosis but there is no evidence of ordinary diverticulitis. Electronically Signed   By: Nelson Chimes M.D.   On: 12/08/2018 17:08        Scheduled Meds:  pantoprazole (PROTONIX) IV  40 mg Intravenous Q12H   Continuous Infusions:  sodium chloride 100 mL/hr at 12/09/18 0815   cefTRIAXone (ROCEPHIN)  IV 2 g (12/08/18 2235)   metronidazole 500 mg (12/09/18 0559)     LOS: 1 day     Vernell Leep, MD, FACP, [facility]. [facility]  To contact the attending provider between 7A-7P or the covering provider during after hours 7P-7A, please log into the web site [email or web address] and access using universal [place] password for that web site. If you do not have the password, please call the hospital operator.  12/09/2018, 10:20 AM

## 2018-12-10 LAB — CBC WITH DIFFERENTIAL/PLATELET
Abs Immature Granulocytes: 0.01 10*3/uL (ref 0.00–0.07)
Basophils Absolute: 0 10*3/uL (ref 0.0–0.1)
Basophils Relative: 1 %
Eosinophils Absolute: 0.2 10*3/uL (ref 0.0–0.5)
Eosinophils Relative: 4 %
HCT: 39.2 % (ref 36.0–46.0)
Hemoglobin: 13.1 g/dL (ref 12.0–15.0)
Immature Granulocytes: 0 %
Lymphocytes Relative: 33 %
Lymphs Abs: 1.6 10*3/uL (ref 0.7–4.0)
MCH: 30.1 pg (ref 26.0–34.0)
MCHC: 33.4 g/dL (ref 30.0–36.0)
MCV: 90.1 fL (ref 80.0–100.0)
Monocytes Absolute: 0.5 10*3/uL (ref 0.1–1.0)
Monocytes Relative: 10 %
Neutro Abs: 2.5 10*3/uL (ref 1.7–7.7)
Neutrophils Relative %: 52 %
Platelets: 144 10*3/uL — ABNORMAL LOW (ref 150–400)
RBC: 4.35 MIL/uL (ref 3.87–5.11)
RDW: 13.3 % (ref 11.5–15.5)
WBC: 4.7 10*3/uL (ref 4.0–10.5)
nRBC: 0 % (ref 0.0–0.2)

## 2018-12-10 MED ORDER — METRONIDAZOLE 500 MG PO TABS: 500.0000 mg | ORAL_TABLET | Freq: Three times a day (TID) | ORAL | 0 refills | Status: AC

## 2018-12-10 MED ORDER — CIPROFLOXACIN HCL 500 MG PO TABS: 500.0000 mg | ORAL_TABLET | Freq: Two times a day (BID) | ORAL | 0 refills | Status: AC

## 2018-12-10 NOTE — Progress Notes (Signed)
[facility] Progress Note  Subjective: She is feeling much better.  She is tolerating her diet  Objective: Vital signs in last 24 hours: Temp:  [97.7 F (36.5 C)-98.1 F (36.7 C)] 97.7 F (36.5 C) (07/17 0854) Pulse Rate:  [68-74] 70 (07/17 0854) Resp:  [16-18] 16 (07/17 0854) BP: (137-168)/(62-83) 137/70 (07/17 0854) SpO2:  [93 %-100 %] 98 % (07/17 0854) Weight change:    PE:  Abdomen soft with only minimal discomfort  Lab Results: Results for orders placed or performed during the hospital encounter of 12/08/18 (from the past 24 hour(s))  CBC WITH DIFFERENTIAL     Status: Abnormal   Collection Time: 12/10/18  5:44 AM  Result Value Ref Range   WBC 4.7 4.0 - 10.5 K/uL   RBC 4.35 3.87 - 5.11 MIL/uL   Hemoglobin 13.1 12.0 - 15.0 g/dL   HCT 39.2 36.0 - 46.0 %   MCV 90.1 80.0 - 100.0 fL   MCH 30.1 26.0 - 34.0 pg   MCHC 33.4 30.0 - 36.0 g/dL   RDW 13.3 11.5 - 15.5 %   Platelets 144 (L) 150 - 400 K/uL   nRBC 0.0 0.0 - 0.2 %   Neutrophils Relative % 52 %   Neutro Abs 2.5 1.7 - 7.7 K/uL   Lymphocytes Relative 33 %   Lymphs Abs 1.6 0.7 - 4.0 K/uL   Monocytes Relative 10 %   Monocytes Absolute 0.5 0.1 - 1.0 K/uL   Eosinophils Relative 4 %   Eosinophils Absolute 0.2 0.0 - 0.5 K/uL   Basophils Relative 1 %   Basophils Absolute 0.0 0.0 - 0.1 K/uL   Immature Granulocytes 0 %   Abs Immature Granulocytes 0.01 0.00 - 0.07 K/uL    Studies/Results: No results found.    Assessment: Ischemic colitis  Plan:   I think she can go home.  Discussed case with Dr. Lindaann Pascal.  Would send her home on a 7-day course of Cipro and Flagyl.  Follow-up with her primary gastroenterologist in a couple of weeks    Cassell Clement 12/10/2018, 9:21 AM  Pager: [phone] If no answer or after 5 PM call [phone]

## 2018-12-10 NOTE — Progress Notes (Signed)
DISCHARGE NOTE [place] to be discharged Home per MD order. Discussed prescriptions and follow up appointments with the patient. Prescriptions given to patient; medication list explained in detail. Patient verbalized understanding.  Skin clean, dry and intact without evidence of skin break down, no evidence of skin tears noted. IV catheter discontinued intact. Site without signs and symptoms of complications. Dressing and pressure applied. Pt denies pain at the site currently. No complaints noted.  Patient free of lines, drains, and wounds.   An After Visit Summary (AVS) was printed and given to the patient. Patient escorted via wheelchair, and discharged home via private auto.  Aneta Mins BSN, RN3

## 2018-12-10 NOTE — Plan of Care (Signed)
  Problem: Education: Goal: Ability to identify signs and symptoms of gastrointestinal bleeding will improve Outcome: Progressing   Problem: Bowel/Gastric: Goal: Will show no signs and symptoms of gastrointestinal bleeding Outcome: Progressing   Problem: Coping: Goal: Level of anxiety will decrease Outcome: Progressing   Problem: Pain Managment: Goal: General experience of comfort will improve Outcome: Progressing

## 2018-12-10 NOTE — Discharge Summary (Addendum)
Physician Discharge Summary  Ashley Solomon [id number] DOB: 1934-08-26  PCP: [name], MD  Admit date: 12/08/2018 Discharge date: 12/10/2018  Recommendations for Outpatient Follow-up:  1. Dr. [name], PCP in 1 week with repeat labs (CBC & BMP). 2. Please review patient's home vitamins (i.e. vitamin D) for duplication and adjust or reduce as needed.  Defer to follow-up with her PCP. 3. Dr. Wilford Corner, [facility] in 2 weeks.  Home Health: None Equipment/Devices: None  Discharge Condition: Improved and stable CODE STATUS: Full Diet recommendation: Soft diet for 1 to 2 weeks and then can advance to regular consistency heart healthy diet gradually as tolerated.  Discharge Diagnoses:  Principal Problem:   Acute colitis Active Problems:   Rectal bleed   Benign essential HTN   GERD (gastroesophageal reflux disease)   Depression   Brief Summary: 84 year old widowed female, lives alone and is independent, PMH of HTN, GERD, hyponatremia, syncope, presented to ED with complaints of diarrhea,?  Abdominal pain and bright red bleeding per rectum.  CT abdomen showed descending colitis.  [facility] consulted.   Assessment & Plan:  Acute descending colitis/bright red bleeding per rectum  Infectious (seems less likely) versus ischemic (possibly)  Patient reports approximately 61-month history of intermittent constipation but night prior to admission, had several soft BMs eventually followed by bright red blood in stools without significant abdominal pain, nausea, vomiting, fever or chills.  CT abdomen shows descending colitis.  Does not seem infectious in the absence of fever, leukocytosis, abdominal pain or tenderness.  Empirically on IV ceftriaxone and metronidazole started on admission.  Last colonoscopy and non-benign polypectomy reportedly in 2017.  [facility] consultation and follow-up appreciated.  I discussed with Dr. [name].  Diet was advanced to soft diet which  she has tolerated without nausea, vomiting or abdominal pain.  She had one BM this morning in the last 24 hours which she describes as dark brown stool with a spot or 2 of dark red blood which was confirmed with RN who witnessed her stool.  Clinically improved.  As per Dr. [name], since patient has clinically improved with antibiotics, recommends discharging on a week's course of Cipro and Flagyl with outpatient follow-up with her primary GI in a couple of weeks.  Essential hypertension  Mildly uncontrolled.  Home dose of amlodipine 2.5 mg at bedtime which was temporarily held in the hospital to avoid hypotension was resumed at discharge.  Outpatient follow-up.  GERD  Continue home dose of Pepcid.  Rectal bleeding does not seem like upper GI bleed.  Depression  Stable.  Continue Cymbalta.  Hypokalemia  Replaced.  Follow BMP as outpatient.  Mild thrombocytopenia  Unclear etiology.?  Related to antibiotics.  Follow CBC as outpatient in a week's time.   Consultants:  [facility]  Procedures:  None   Discharge Instructions  Discharge Instructions    Call MD for:   Complete by: As directed    Recurrent or worsening rectal bleeding.   Call MD for:  difficulty breathing, headache or visual disturbances   Complete by: As directed    Call MD for:  extreme fatigue   Complete by: As directed    Call MD for:  persistant dizziness or light-headedness   Complete by: As directed    Call MD for:  persistant nausea and vomiting   Complete by: As directed    Call MD for:  severe uncontrolled pain   Complete by: As directed    Call MD for:  temperature >100.4  Complete by: As directed    Diet - low sodium heart healthy   Complete by: As directed    Soft diet for 1 to 2 weeks and then gradually advance to regular diet as tolerated.   Increase activity slowly   Complete by: As directed        Medication List    TAKE these medications   acetaminophen 500 MG tablet Commonly  known as: TYLENOL Take 500 mg by mouth every 6 (six) hours as needed for mild pain or headache.   amLODipine 2.5 MG tablet Commonly known as: NORVASC Take 2.5 mg by mouth at bedtime.   CALCIUM-VITAMIN D PO Take 600-800 tablets by mouth daily. 800   ciprofloxacin 500 MG tablet Commonly known as: Cipro Take 1 tablet (500 mg total) by mouth 2 (two) times daily for 7 days.   DULoxetine 30 MG capsule Commonly known as: CYMBALTA Take 30 mg by mouth daily.   famotidine 20 MG tablet Commonly known as: PEPCID Take 20 mg by mouth at bedtime.   FISH OIL + D3 PO Take 1,200 mg by mouth daily.   metroNIDAZOLE 500 MG tablet Commonly known as: Flagyl Take 1 tablet (500 mg total) by mouth 3 (three) times daily for 7 days.   PRESCRIPTION MEDICATION as needed. 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   vitamin B-12 1000 MCG tablet Commonly known as: CYANOCOBALAMIN Take 1,000 mcg by mouth daily.   vitamin C 500 MG tablet Commonly known as: ASCORBIC ACID Take 500 mg by mouth daily.   VITAMIN D PO Take 1,000 mg by mouth daily.   vitamin E 400 UNIT capsule Take 400 mg by mouth daily.      Follow-up Information    [name], MD. Schedule an appointment as soon as possible for a visit in 1 week(s).   Specialty: Family Medicine Why: To be seen with repeat labs (CBC & BMP). Contact information: [place] [address] [phone]        [place].   Specialty: Emergency Medicine Why: If symptoms worsen Contact information: [address] [id number] [place]       Wilford Corner, MD. Schedule an appointment as soon as possible for a visit in 2 week(s).   Specialty: Gastroenterology Contact information: [address] [place] [address] [phone]          No  Known Allergies    Procedures/Studies: Ct Abdomen Pelvis W Contrast  Result Date: 12/08/2018 CLINICAL DATA:  Abdominal pain. Suspected diverticulitis. Rectal bleeding. EXAM: CT ABDOMEN AND PELVIS WITH CONTRAST TECHNIQUE: Multidetector CT imaging of the abdomen and pelvis was performed using the standard protocol following bolus administration of intravenous contrast. CONTRAST:  134mL OMNIPAQUE IOHEXOL 300 MG/ML  SOLN COMPARISON:  None. FINDINGS: Lower chest: Linear markings at both lung bases more likely to represent scarring than atelectasis. No consolidation. No pleural effusion. Hepatobiliary: Liver parenchyma is normal.  No calcified gallstones. Pancreas: Normal Spleen: Normal Adrenals/Urinary Tract: Adrenal glands are normal. Renal parenchyma is normal. Bladder is normal. Stomach/Bowel: Wall thickening throughout the left colon in the descending colon segment. This could be due to infectious or ischemic colitis. Patient has diverticulosis but there is no evidence of infectious diverticulitis. Transverse colon and right colon are normal, with a mobile mesentery. Vascular/Lymphatic: Aortic atherosclerosis. No aneurysm. IVC is normal. No  retroperitoneal adenopathy. Reproductive: Previous hysterectomy.  No pelvic mass. Other: No free fluid or air. Musculoskeletal: Chronic lumbar degenerative changes. IMPRESSION: Wall thickening throughout the descending colon which could be due to infectious colitis or ischemic colitis. No evidence of perforation or abscess. The patient does have diverticulosis but there is no evidence of ordinary diverticulitis. Electronically Signed   By: Nelson Chimes M.D.   On: 12/08/2018 17:08      Subjective: Patient reports that she has been doing well.  Eager to go home.  Tolerating soft diet without nausea, vomiting or abdominal pain.  Some gaseous feeling in the abdomen.  Had a small BM this morning which was dark brown stool, not melena along with 1 to 2 drops of dark red  blood which is significantly better compared to on admission.  Has been ambulating in the room without difficulty, without dizziness, lightheadedness, chest pain or dyspnea.  Denies any other complaints.  Discharge Exam:  Vitals:   12/09/18 1628 12/09/18 2025 12/10/18 0431 12/10/18 0854  BP: 140/62 (!) 165/83 (!) 168/80 137/70  Pulse: 74 72 68 70  Resp: 18 18 16 16   Temp: 98 F (36.7 C) 98.1 F (36.7 C) 97.8 F (36.6 C) 97.7 F (36.5 C)  TempSrc: Oral   Oral  SpO2: 100% 93% 95% 98%  Weight:      Height:        General exam: Pleasant elderly female, moderately built and nourished lying comfortably propped up in bed without distress.  Oral mucosa moist. Respiratory system: Clear to auscultation. Respiratory effort normal. Cardiovascular system: S1 & S2 heard, RRR. No JVD, murmurs, rubs, gallops or clicks. No pedal edema. Gastrointestinal system: Abdomen is nondistended, soft and nontender. No organomegaly or masses felt. Normal bowel sounds heard.  Central nervous system: Alert and oriented. No focal neurological deficits. Extremities: Symmetric 5 x 5 power. Skin: No rashes, lesions or ulcers Psychiatry: Judgement and insight appear normal. Mood & affect appropriate.     The results of significant diagnostics from this hospitalization (including imaging, microbiology, ancillary and laboratory) are listed below for reference.     Microbiology: Recent Results (from the past 240 hour(s))  SARS Coronavirus 2 (CEPHEID - Performed in [facility] hospital lab), Hosp Order     Status: None   Collection Time: 12/08/18  9:00 PM   Specimen: Nasopharyngeal Swab  Result Value Ref Range Status   SARS Coronavirus 2 NEGATIVE NEGATIVE Final    Comment: (NOTE) If result is NEGATIVE SARS-CoV-2 target nucleic acids are NOT DETECTED. The SARS-CoV-2 RNA is generally detectable in upper and lower  respiratory specimens during the acute phase of infection. The lowest  concentration of SARS-CoV-2  viral copies this assay can detect is 250  copies / mL. A negative result does not preclude SARS-CoV-2 infection  and should not be used as the sole basis for treatment or other  patient management decisions.  A negative result may occur with  improper specimen collection / handling, submission of specimen other  than nasopharyngeal swab, presence of viral mutation(s) within the  areas targeted by this assay, and inadequate number of viral copies  (<250 copies / mL). A negative result must be combined with clinical  observations, patient history, and epidemiological information. If result is POSITIVE SARS-CoV-2 target nucleic acids are DETECTED. The SARS-CoV-2 RNA is generally detectable in upper and lower  respiratory specimens dur ing the acute phase of infection.  Positive  results are indicative of active infection with SARS-CoV-2.  Clinical  correlation with patient history and other diagnostic information is  necessary to determine patient infection status.  Positive results do  not rule out bacterial infection or co-infection with other viruses. If result is PRESUMPTIVE POSTIVE SARS-CoV-2 nucleic acids MAY BE PRESENT.   A presumptive positive result was obtained on the submitted specimen  and confirmed on repeat testing.  While 2019 novel coronavirus  (SARS-CoV-2) nucleic acids may be present in the submitted sample  additional confirmatory testing may be necessary for epidemiological  and / or clinical management purposes  to differentiate between  SARS-CoV-2 and other Sarbecovirus currently known to infect humans.  If clinically indicated additional testing with an alternate test  methodology [id number]) is advised. The SARS-CoV-2 RNA is generally  detectable in upper and lower respiratory sp ecimens during the acute  phase of infection. The expected result is Negative. Fact Sheet for Patients:  StrictlyIdeas.no Fact Sheet for Healthcare  Providers: BankingDealers.co.za This test is not yet approved or cleared by the [place] FDA and has been authorized for detection and/or diagnosis of SARS-CoV-2 by FDA under an Emergency Use Authorization (EUA).  This EUA will remain in effect (meaning this test can be used) for the duration of the COVID-19 declaration under Section 564(b)(1) of the Act, 21 U.S.C. section 360bbb-3(b)(1), unless the authorization is terminated or revoked sooner. Performed at [facility], [place] [address] [place], [place] [address]      Labs: CBC: Recent Labs  Lab 12/08/18 1434 12/08/18 2215 12/09/18 0408 12/10/18 0544  WBC 9.4 9.3 7.1 4.7  NEUTROABS 7.4 6.9 4.7 2.5  HGB 14.4 13.7 13.0 13.1  HCT 43.1 40.5 38.9 39.2  MCV 89.4 87.7 89.4 90.1  PLT 168 164 152 202*   Basic Metabolic Panel: Recent Labs  Lab 12/08/18 1434 12/09/18 0408  NA 135 137  K 3.6 3.4*  CL 101 103  CO2 20* 24  GLUCOSE 131* 121*  BUN 17 10  CREATININE 0.97 0.92  CALCIUM 9.4 8.2*   Liver Function Tests: Recent Labs  Lab 12/08/18 1434 12/09/18 0408  AST 27 21  ALT 13 11  ALKPHOS 65 55  BILITOT 0.7 1.0  PROT 6.8 5.7*  ALBUMIN 4.0 3.3*   Patient kindly declined MDs offer to call and discuss with family to update care.  She stated that she will take care of that.   Time coordinating discharge: 25 minutes  SIGNED:  Vernell Leep, MD, FACP, [facility]. [facility]  To contact the attending provider between 7A-7P or the covering provider during after hours 7P-7A, please log into the web site [email or web address] and access using universal  password for that web site. If you do not have the password, please call the hospital operator.

## 2018-12-10 NOTE — Discharge Instructions (Signed)

## 2019-06-18 ENCOUNTER — Ambulatory Visit: Payer: Federal, State, Local not specified - PPO | Attending: Internal Medicine

## 2019-06-18 DIAGNOSIS — Z23 Encounter for immunization: Secondary | ICD-10-CM | POA: Insufficient documentation

## 2019-06-18 NOTE — Progress Notes (Signed)
   Covid-19 Vaccination Clinic  Name:  Ashley Solomon    MRN: [id number] DOB: 04/20/1935  06/18/2019  Ashley Solomon was observed post Covid-19 immunization for 15 minutes without incidence. She was provided with Vaccine Information Sheet and instruction to access the V-Safe system.   Ashley Solomon was instructed to call 911 with any severe reactions post vaccine: Marland Kitchen Difficulty breathing  . Swelling of your face and throat  . A fast heartbeat  . A bad rash all over your body  . Dizziness and weakness    Immunizations Administered    Name Date Dose VIS Date Route   Pfizer COVID-19 Vaccine 06/18/2019 11:10 AM 0.3 mL 05/06/2019 Intramuscular   Manufacturer: [place]   Lot: [id number]   [place]: [id number]

## 2019-07-09 ENCOUNTER — Ambulatory Visit: Payer: Federal, State, Local not specified - PPO | Attending: Internal Medicine

## 2019-07-09 DIAGNOSIS — Z23 Encounter for immunization: Secondary | ICD-10-CM

## 2019-07-09 NOTE — Progress Notes (Signed)
   Covid-19 Vaccination Clinic  Name:  Ashley Solomon    MRN: [id number] DOB: Oct 06, 1934  07/09/2019  [name] was observed post Covid-19 immunization for 15 minutes without incidence. She was provided with Vaccine Information Sheet and instruction to access the V-Safe system.   [name] was instructed to call 911 with any severe reactions post vaccine: Marland Kitchen Difficulty breathing  . Swelling of your face and throat  . A fast heartbeat  . A bad rash all over your body  . Dizziness and weakness    Immunizations Administered    Name Date Dose VIS Date Route   Pfizer COVID-19 Vaccine 07/09/2019 10:37 AM 0.3 mL 05/06/2019 Intramuscular   Manufacturer: [place]   Lot: [id number]   [place]: [id number]

## 2019-08-26 ENCOUNTER — Other Ambulatory Visit: Payer: Self-pay | Admitting: Gastroenterology

## 2019-08-26 DIAGNOSIS — R195 Other fecal abnormalities: Secondary | ICD-10-CM

## 2019-08-31 ENCOUNTER — Ambulatory Visit
Admission: RE | Admit: 2019-08-31 | Discharge: 2019-08-31 | Disposition: A | Discharge status: Home / Self Care | Payer: Federal, State, Local not specified - PPO | Source: Ambulatory Visit | Attending: Gastroenterology | Admitting: Gastroenterology

## 2019-08-31 DIAGNOSIS — R195 Other fecal abnormalities: Secondary | ICD-10-CM

## 2019-11-16 ENCOUNTER — Encounter: Payer: Self-pay | Admitting: Neurology

## 2019-11-16 ENCOUNTER — Other Ambulatory Visit: Payer: Self-pay

## 2019-11-16 ENCOUNTER — Telehealth: Payer: Self-pay | Admitting: Neurology

## 2019-11-16 ENCOUNTER — Ambulatory Visit: Payer: Federal, State, Local not specified - PPO | Admitting: Neurology

## 2019-11-16 VITALS — BP 130/80 | HR 81 | Ht 66.5 in | Wt 142.5 lb

## 2019-11-16 DIAGNOSIS — M79605 Pain in left leg: Secondary | ICD-10-CM | POA: Diagnosis not present

## 2019-11-16 DIAGNOSIS — G629 Polyneuropathy, unspecified: Secondary | ICD-10-CM | POA: Diagnosis not present

## 2019-11-16 DIAGNOSIS — M79604 Pain in right leg: Secondary | ICD-10-CM | POA: Diagnosis not present

## 2019-11-16 DIAGNOSIS — R208 Other disturbances of skin sensation: Secondary | ICD-10-CM | POA: Insufficient documentation

## 2019-11-16 MED ORDER — DULOXETINE HCL 60 MG PO CPEP: 60.0000 mg | ORAL_CAPSULE | Freq: Every day | ORAL | 5 refills | Status: DC

## 2019-11-16 NOTE — Progress Notes (Signed)
[facility]  PATIENT: Ashley Solomon DOB: May 07, 1935  REFERRING DOCTOR OR PCP: [name], MD SOURCE: Patient, notes from PCP,  _________________________________   HISTORICAL  CHIEF COMPLAINT:  Chief Complaint  Patient presents with  . New Patient (Initial Visit)    RM 13, alone. Paper referral from [name], MD (PCP) for leg numbness. Having numbness in feet (bilaterally) and radiates up leg. Has stinging/burning pain. Walking exacerbates sx. She is trying to adjust her footwear. Sx do not bother her during the day, only at nighttime. Left foot sx started 6-9 months ago and then gradually started in her right foot. Denies hx of diabetes.     HISTORY OF PRESENT ILLNESS:  I had the pleasure of seeing your patient, [name], at [facility] for neurologic consultation regarding her progressive numbness, mostly involving the left leg.  She is an 85 year old woman with progressive numbness, left >> right.   Symptoms started in her left foot about 6-9 months ago with a burning dysesthetic sensation.  The left large toe has the most intense pain and was involved first.   She gets a sensation like walking on cardboard around the great toe.   More recently numbness and pain has spread to the left foot and ankle and sometimes to the right foot (but not leg).    She is active during the day and does not note much pain.  However, at night, after quickly falling asleep she often wakes up with intense burning pain.   She notes she has less ability to spread her toes in both feet compared to last year but has no weakness with toe extension or ankle extension.   She has occasionally (once a day) had a mild left foot drop and catches her toe that may lead to a stumble and twice a fall.   She has noted mild left biceps weakness x 2-3 years.    She has melanoma and has had 3 operations in her left face and several in her legs to remove spots.    She  does not have a diagnosis of DM though hemoglobin A1c was 6.1..   She is on low dose amlodipine for mild HTN.    She is on Cymbalta 30 mg for anxiety.     Lab work shows slightly low white blood cell count (3.8 with mildly reduced neutrophils of 1.8.  GFR was slightly reduced at 56.  Cholesterol was mildly elevated (225 with LDL of 139)    REVIEW OF SYSTEMS: Constitutional: No fevers, chills, sweats, or change in appetite Eyes: No visual changes, double vision, eye pain Ear, nose and throat: No hearing loss, ear pain, nasal congestion, sore throat Cardiovascular: No chest pain, palpitations Respiratory: No shortness of breath at rest or with exertion.   No wheezes GastrointestinaI: No nausea, vomiting, diarrhea, abdominal pain, fecal incontinence Genitourinary: No dysuria, urinary retention or frequency.  No nocturia. Musculoskeletal: No neck pain, back pain Integumentary: No rash, pruritus, skin lesions Neurological: as above Psychiatric: No depression at this time.  She has some anxiety Endocrine: No palpitations, diaphoresis, change in appetite, change in weigh or increased thirst Hematologic/Lymphatic: No anemia, purpura, petechiae. Allergic/Immunologic: No itchy/runny eyes, nasal congestion, recent allergic reactions, rashes  ALLERGIES: Allergies  Allergen Reactions  . Lexapro [Escitalopram]     "could not function"  . Prozac [Fluoxetine]     loose BM's    HOME MEDICATIONS:  Current Outpatient Medications:  .  acetaminophen (TYLENOL) 500 MG  tablet, Take 500 mg by mouth every 6 (six) hours as needed for mild pain or headache., Disp: , Rfl:  .  amLODipine (NORVASC) 2.5 MG tablet, Take 2.5 mg by mouth at bedtime. , Disp: , Rfl:  .  ASPIRIN 81 PO, Take 1 tablet by mouth daily., Disp: , Rfl:  .  CALCIUM-VITAMIN D PO, Take 600-800 tablets by mouth daily. 800, Disp: , Rfl:  .  Cholecalciferol (VITAMIN D PO), Take 1,000 mg by mouth daily. , Disp: , Rfl:  .  DULoxetine (CYMBALTA)  30 MG capsule, Take 30 mg by mouth daily., Disp: , Rfl: 3 .  famotidine (PEPCID) 20 MG tablet, Take 20 mg by mouth at bedtime. , Disp: , Rfl:  .  Fish Oil-Cholecalciferol (FISH OIL + D3 PO), Take 1,200 mg by mouth daily. , Disp: , Rfl:  .  PRESCRIPTION MEDICATION, as needed. Pt reports that she has a prosthetic right knee and must have an antibiotic when she has an open wound., Disp: , Rfl:  .  vitamin B-12 (CYANOCOBALAMIN) 1000 MCG tablet, Take 1,000 mcg by mouth daily. , Disp: , Rfl:  .  vitamin C (ASCORBIC ACID) 500 MG tablet, Take 500 mg by mouth daily., Disp: , Rfl:  .  vitamin E 400 UNIT capsule, Take 400 mg by mouth daily. , Disp: , Rfl:   PAST MEDICAL HISTORY: Past Medical History:  Diagnosis Date  . Anxiety   . Bloating   . Borborygmi   . Chronic kidney disease, stage 3   . Colitis   . Displaced fracture of distal end of left radius 07/07/2015  . Elevated glucose   . GERD (gastroesophageal reflux disease)   . Hiatal hernia   . Hyperlipidemia   . Hypertension   . Hyponatremia 10/31/2017  . Light stools   . Melanoma ([place])   . Osteoporosis   . Syncope 10/31/2017  . Syncope, vasovagal 10/31/2017  . Vitamin D deficiency     PAST SURGICAL HISTORY: Past Surgical History:  Procedure Laterality Date  . ABDOMINAL HYSTERECTOMY    . BREAST EXCISIONAL BIOPSY Left 2004  . CATARACT EXTRACTION, BILATERAL    . COLONOSCOPY    . ORIF WRIST FRACTURE Left 07/08/2015   Procedure: OPEN REDUCTION INTERNAL FIXATION (ORIF) WRIST FRACTURE;  Surgeon: Roseanne Kaufman, MD;  Location: [facility];  Service: Orthopedics;  Laterality: Left;  . REPLACEMENT TOTAL KNEE Right   . WRIST SURGERY      FAMILY HISTORY: Family History  Problem Relation Age of Onset  . Breast cancer Paternal Aunt     SOCIAL HISTORY:  Social History   Socioeconomic History  . Marital status: Married    Spouse name: Not on file  . Number of children: Not on file  . Years of education: Not on file  . Highest education level:  Not on file  Occupational History  . Not on file  Tobacco Use  . Smoking status: Never Smoker  . Smokeless tobacco: Never Used  Substance and Sexual Activity  . Alcohol use: Yes    Comment: 1 glass wine twice weekly  . Drug use: No  . Sexual activity: Not on file  Other Topics Concern  . Not on file  Social History Narrative   Lives alone   Caffeine use: 1/2-1 cup coffee per day    Right handed    Social Determinants of Health   Financial Resource Strain:   . Difficulty of Paying Living Expenses:   Food Insecurity:   . Worried  About Running Out of Food in the Last Year:   . [place] in the Last Year:   Transportation Needs:   . Lack of Transportation (Medical):   Marland Kitchen Lack of Transportation (Non-Medical):   Physical Activity:   . Days of Exercise per Week:   . Minutes of Exercise per Session:   Stress:   . Feeling of Stress :   Social Connections:   . Frequency of Communication with Friends and Family:   . Frequency of Social Gatherings with Friends and Family:   . Attends Religious Services:   . Active Member of Clubs or Organizations:   . Attends Archivist Meetings:   Marland Kitchen Marital Status:   Intimate Partner Violence:   . Fear of Current or Ex-Partner:   . Emotionally Abused:   Marland Kitchen Physically Abused:   . Sexually Abused:      PHYSICAL EXAM  Vitals:   11/16/19 1012  BP: 130/80  Pulse: 81  Weight: 142 lb 8 oz (64.6 kg)  Height: 5' 6.5" (1.689 m)    Body mass index is 22.66 kg/m.   General: The patient is well-developed and well-nourished and in no acute distress  HEENT:  Head is [place]/AT.  Sclera are anicteric.     Neck: No carotid bruits are noted.  The neck is nontender.  Cardiovascular: The heart has a regular rate and rhythm with a normal S1 and S2. There were no murmurs, gallops or rubs.    Skin: Extremities are without rash or  edema.  Musculoskeletal:  Back is nontender  Neurologic Exam  Mental status: The patient is alert and  oriented x 3 at the time of the examination. The patient has apparent normal recent and remote memory, with an apparently normal attention span and concentration ability.   Speech is normal.  Cranial nerves: Extraocular movements are full. Pupils are equal, round, and reactive to light and accomodation.  Visual fields are full.  Facial symmetry is present. There is good facial sensation to soft touch bilaterally.Facial strength is normal.  Trapezius and sternocleidomastoid strength is normal. No dysarthria is noted.  The tongue is midline, and the patient has symmetric elevation of the soft palate. No obvious hearing deficits are noted.  Motor:  Muscle bulk is normal.   Tone is normal. Strength is  5 / 5 in all 4 extremities including the foot muscles.   Sensory: Sensory testing is intact to pinprick, soft touch and vibration sensation in the arms.  However, she has reduced vibration sensation in both feet, worse on the left.  She also had slight reduced pinprick sensation in the feet, also worse in the left..  Coordination: Cerebellar testing reveals good finger-nose-finger and heel-to-shin bilaterally.  Gait and station: Station is normal.   Gait is normal. Tandem gait is mildly wide but possibly normal for her age. Romberg is negative.   Reflexes: Deep tendon reflexes are symmetric and normal in the arms but only 1+ in the legs.   Plantar responses are flexor.    DIAGNOSTIC DATA (LABS, IMAGING, TESTING) - I reviewed patient records, labs, notes, testing and imaging myself where available.  Lab Results  Component Value Date   WBC 4.7 12/10/2018   HGB 13.1 12/10/2018   HCT 39.2 12/10/2018   MCV 90.1 12/10/2018   PLT 144 (L) 12/10/2018      Component Value Date/Time   NA 137 12/09/2018 0408   K 3.4 (L) 12/09/2018 0408   CL 103 12/09/2018 0408  CO2 24 12/09/2018 0408   GLUCOSE 121 (H) 12/09/2018 0408   BUN 10 12/09/2018 0408   CREATININE 0.92 12/09/2018 0408   CALCIUM 8.2 (L)  12/09/2018 0408   PROT 5.7 (L) 12/09/2018 0408   ALBUMIN 3.3 (L) 12/09/2018 0408   AST 21 12/09/2018 0408   ALT 11 12/09/2018 0408   ALKPHOS 55 12/09/2018 0408   BILITOT 1.0 12/09/2018 0408   GFRNONAA 58 (L) 12/09/2018 0408   GFRAA >60 12/09/2018 0408       ASSESSMENT AND PLAN  Polyneuropathy - Plan: Sjogren's syndrome antibods(ssa + ssb), NCV with EMG(electromyography), Multiple Myeloma Panel (SPEP&IFE w/QIG), Sedimentation rate, Cryoglobulin  Dysesthesia - Plan: Sjogren's syndrome antibods(ssa + ssb), NCV with EMG(electromyography), Multiple Myeloma Panel (SPEP&IFE w/QIG), Sedimentation rate, Cryoglobulin  Pain in both lower extremities   In summary, Ms. Pangborn is an 85 year old woman who appears to have a polyneuropathy with superimposed for the numbness in the left foot.  The etiology of her polyneuropathy is uncertain.  She appears to have a mildly elevated hemoglobin A1c but not definite diabetes mellitus.  It is possible to have a polyneuropathy very early in the course of DM that we need to rule out other cause.  I will check SPEP/IFE, SSA/SSB, ESR and cryoglobulins.  Additionally, we will check an NCV/EMG study.  If she does not have evidence of definite polyneuropathy on NCV, consider MRI of the lumbar spine to further evaluate.    To help with her pain, I will increase her Cymbalta from 30 mg to 60 mg.  If she is no better in 3 to 4 weeks, consider adding gabapentin.  She will return to see me when she comes in for the NCV/EMG study.  She should call sooner if she has significant new or worsening neurologic symptoms.  Thank you for asking me to see Ms. Rison.  Please let me know if I can be of further assistance with her or other patients in the future.   Syreeta Figler A. Felecia Shelling, MD, [facility] 9/44/9675, 91:63 AM Certified in Neurology, Clinical Neurophysiology, Sleep Medicine and Neuroimaging  [facility] [address] [place], [place]  [address] [phone]

## 2019-11-16 NOTE — Telephone Encounter (Signed)
Patient needs to reschedule NCV/ EMG . Thanks Hinton Dyer

## 2019-11-21 LAB — MULTIPLE MYELOMA PANEL, SERUM
Albumin SerPl Elph-Mcnc: 3.8 g/dL (ref 2.9–4.4)
Albumin/Glob SerPl: 1.4 (ref 0.7–1.7)
Alpha 1: 0.2 g/dL (ref 0.0–0.4)
Alpha2 Glob SerPl Elph-Mcnc: 0.7 g/dL (ref 0.4–1.0)
B-Globulin SerPl Elph-Mcnc: 1 g/dL (ref 0.7–1.3)
Gamma Glob SerPl Elph-Mcnc: 1.1 g/dL (ref 0.4–1.8)
Globulin, Total: 2.9 g/dL (ref 2.2–3.9)
IgA/Immunoglobulin A, Serum: 102 mg/dL (ref 64–422)
IgG (Immunoglobin G), Serum: 999 mg/dL (ref 586–1602)
IgM (Immunoglobulin M), Srm: 88 mg/dL (ref 26–217)
Total Protein: 6.7 g/dL (ref 6.0–8.5)

## 2019-11-21 LAB — CRYOGLOBULIN

## 2019-11-21 LAB — SJOGREN'S SYNDROME ANTIBODS(SSA + SSB)
ENA SSA (RO) Ab: <0.2 AI (ref 0.0–0.9)
ENA SSB (LA) Ab: <0.2 AI (ref 0.0–0.9)

## 2019-11-21 LAB — SEDIMENTATION RATE: Sed Rate: 2 mm/hr (ref 0–40)

## 2019-12-09 ENCOUNTER — Other Ambulatory Visit: Payer: Self-pay | Admitting: Neurology

## 2019-12-14 ENCOUNTER — Encounter: Payer: Federal, State, Local not specified - PPO | Admitting: Neurology

## 2019-12-20 ENCOUNTER — Encounter (INDEPENDENT_AMBULATORY_CARE_PROVIDER_SITE_OTHER): Payer: Medicare Other | Admitting: Neurology

## 2019-12-20 ENCOUNTER — Ambulatory Visit (INDEPENDENT_AMBULATORY_CARE_PROVIDER_SITE_OTHER): Payer: Federal, State, Local not specified - PPO | Admitting: Neurology

## 2019-12-20 ENCOUNTER — Other Ambulatory Visit: Payer: Self-pay

## 2019-12-20 DIAGNOSIS — Z0289 Encounter for other administrative examinations: Secondary | ICD-10-CM

## 2019-12-20 DIAGNOSIS — G629 Polyneuropathy, unspecified: Secondary | ICD-10-CM | POA: Diagnosis not present

## 2019-12-20 DIAGNOSIS — R208 Other disturbances of skin sensation: Secondary | ICD-10-CM

## 2019-12-20 NOTE — Progress Notes (Signed)
Full Name: Ashley Solomon Gender: Female MRN #: [id number] Date of Birth: 07-16-1934    Visit Date: 12/20/2019 09:28 Age: 85 Years Examining Physician: Arlice Colt, MD  Referring Physician: Arlice Colt, MD Height: 5 feet 6 inch     History: Ashley Solomon is an 85 year old woman with bilateral leg numbness.  Symptoms started on the left but now involve both legs.  She has numbness above the ankles but not to the knees.  She denies back pain or pain in the legs.  She has not noted any weakness.  On exam, she has reduced touch, pinprick and vibration in the toes and ankles.  Nerve conduction studies: The right peroneal motor response was absent.  The right tibial motor response had a normal distal latency but moderately reduced amplitude and mildly reduced conduction velocity.  The tibial F-wave latency was just minimally prolonged.  The sural and superficial peroneal sensory responses in the right foot were absent.  The radial sensory response in the right arm was normal.  The galvanic sympathetic skin response was normal in the foot.  Electromyography: Either EMG of selected muscles of the right leg was performed.  There was mild chronic denervation in the gastrocnemius and tibialis anterior muscle and moderate chronic denervation in the abductor hallucis and extensor digitorum brevis muscles.  There was no acute denervation noted.  Impression: This NCV/EMG study shows the following: 1.  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 2.   There was no evidence of a significant superimposed lumbar radiculopathy.  Ossie Yebra A. Felecia Shelling, MD, PhD, FAAN Certified in Neurology, Clinical Neurophysiology, Sleep Medicine, Pain Medicine and Neuroimaging Director, [place] [facility] [address], [place], [place] [address] [phone]     Verbal informed consent was obtained from the patient, patient was informed of potential risk of procedure, including bruising, bleeding, hematoma formation, infection, muscle weakness, muscle pain, numbness, among others.        [place]    Nerve / Sites Muscle Latency Ref. Amplitude Ref. Rel Amp Segments Distance Velocity Ref. Area    ms ms mV mV %  cm m/s m/s mVms  R Peroneal - EDB     Ankle EDB NR ?6.5 NR ?2.0 NR Ankle - EDB 9   NR     Fib head EDB NR  NR  NR Fib head - Ankle 27 NR ?44 NR     Pop fossa EDB NR  NR  NR Pop fossa - Fib head 10 NR ?44 NR         Pop fossa - Ankle      R Tibial - AH     Ankle AH 4.6 ?5.8 1.1 ?4.0 100 Ankle - AH 9   6.9     Pop fossa AH 14.5  1.1  95.3 Pop fossa - Ankle 33 34 ?41 6.2         SSR    Nerve / Sites Latency   s  R Sympathetic - Foot     Foot 2.76       SNC    Nerve / Sites Rec. Site Peak Lat Ref.  Amp Ref. Segments Distance    ms ms V V  cm  R Radial - Anatomical snuff box (Forearm)     Forearm Wrist 2.6 ?2.9 19 ?15 Forearm - Wrist 10  R Sural - Ankle (Calf)  Calf Ankle NR ?4.4 NR ?6 Calf - Ankle 14  R Superficial peroneal - Ankle     Lat leg Ankle NR ?4.4 NR ?6 Lat leg - Ankle 14           F  Wave    Nerve F Lat Ref.   ms ms  R Tibial - AH 58.8 ?56.0       EMG Summary Table    Spontaneous MUAP Recruitment  Muscle IA Fib PSW Fasc Other Amp Dur. Poly Pattern  R. Abductor hallucis Normal None None None _______ Normal Normal 2+ Discrete  R. Extensor digitorum brevis Normal None None None _______ Normal Increased 2+ Discrete  R. Gastrocnemius (Medial head) Normal None None None _______ Normal Normal 1+ Reduced  R. Tibialis anterior Normal None None None _______ Normal Normal 1+ Reduced  R. Peroneus longus Normal None None None _______ Normal Normal Normal Normal  R. Vastus medialis Normal None None None _______ Normal Normal Normal Normal  R. Gluteus medius Normal None None None _______ Normal Normal Normal  Normal

## 2019-12-21 LAB — VITAMIN B12: Vitamin B-12: 884 pg/mL (ref 232–1245)

## 2020-06-22 ENCOUNTER — Other Ambulatory Visit: Payer: Self-pay | Admitting: Neurology

## 2020-08-01 ENCOUNTER — Ambulatory Visit (INDEPENDENT_AMBULATORY_CARE_PROVIDER_SITE_OTHER): Payer: Federal, State, Local not specified - PPO

## 2020-08-01 ENCOUNTER — Encounter: Payer: Self-pay | Admitting: Podiatry

## 2020-08-01 ENCOUNTER — Ambulatory Visit (INDEPENDENT_AMBULATORY_CARE_PROVIDER_SITE_OTHER): Payer: Federal, State, Local not specified - PPO | Admitting: Podiatry

## 2020-08-01 ENCOUNTER — Other Ambulatory Visit: Payer: Self-pay

## 2020-08-01 DIAGNOSIS — M79672 Pain in left foot: Secondary | ICD-10-CM

## 2020-08-01 DIAGNOSIS — M79671 Pain in right foot: Secondary | ICD-10-CM

## 2020-08-01 DIAGNOSIS — M7671 Peroneal tendinitis, right leg: Secondary | ICD-10-CM

## 2020-08-01 DIAGNOSIS — M25571 Pain in right ankle and joints of right foot: Secondary | ICD-10-CM | POA: Diagnosis not present

## 2020-08-01 MED ORDER — TRIAMCINOLONE ACETONIDE 10 MG/ML IJ SUSP: 10.0000 mg | Freq: Once | INTRAMUSCULAR | Status: AC

## 2020-08-01 NOTE — Progress Notes (Signed)
Subjective:   Patient ID: Ashley Solomon, female   DOB: 86 y.o.   MRN: [id number]   HPI Patient presents stating she had a lot of pain in the outside of her right ankle and that this is been going on for years and she gets occasional tingling but states it is gotten worse recently and making it hard for her to be active that she likes to be.  Patient does not smoke   Review of Systems  All other systems reviewed and are negative.       Objective:  Physical Exam Vitals and nursing note reviewed.  Constitutional:      Appearance: She is well-developed and well-nourished.  Cardiovascular:     Pulses: Intact distal pulses.  Pulmonary:     Effort: Pulmonary effort is normal.  Musculoskeletal:        General: Normal range of motion.  Skin:    General: Skin is warm.  Neurological:     Mental Status: She is alert.     Neurovascular status intact muscle strength adequate range of motion adequate with quite a bit of discomfort lateral side right ankle underneath the lateral malleolus with slight pain also into the sinus tarsi with moderate swelling and cavus foot structure noted with patient having good digital perfusion well oriented x3     Assessment:  Peroneal tendinitis right with possibility for interstitial tear along with cavus foot structure which can lead to this condition     Plan:  H&P reviewed condition and at this point organ to treat conservatively.  I did sterile prep injected the sheath of the extensor tendon 3 mg dexamethasone Kenalog 5 mg Xylocaine and I applied fascial brace to lift up the lateral foot along with aggressive ice topical medicines and reappoint to recheck.  May require MRI if symptoms persist  X-rays indicate that there is cavus foot structure no indications of arthritis

## 2020-08-29 ENCOUNTER — Ambulatory Visit: Payer: Federal, State, Local not specified - PPO | Admitting: Podiatry

## 2020-08-29 ENCOUNTER — Ambulatory Visit (INDEPENDENT_AMBULATORY_CARE_PROVIDER_SITE_OTHER): Payer: Federal, State, Local not specified - PPO | Admitting: Podiatry

## 2020-08-29 ENCOUNTER — Other Ambulatory Visit: Payer: Self-pay

## 2020-08-29 ENCOUNTER — Encounter: Payer: Self-pay | Admitting: Podiatry

## 2020-08-29 DIAGNOSIS — M7671 Peroneal tendinitis, right leg: Secondary | ICD-10-CM | POA: Diagnosis not present

## 2020-08-30 NOTE — Progress Notes (Signed)
Subjective:   Patient ID: Ashley Solomon, female   DOB: 86 y.o.   MRN: [id number]   HPI Patient states that she still has problems with the right ankle and even though its not hurting her all the time and the bracing is helping she still is aware of the problem   ROS      Objective:  Physical Exam  Neurovascular status intact with patient found to have continued discomfort in the lateral side of the right ankle underneath the lateral malleolus extending distal with moderate fusiform swelling of the area of localized.  There appears to be no popping and I did different test I was not able to cause any trauma to the tendon itself.     Assessment:  Peroneal tendinitis right with possibility for interstitial tear or other advanced pathology     Plan:  H&P reviewed condition at great length.  We discussed different options including topical medicines oral medicines continued brace usage shoe gear modifications and I discussed MRI with patient.  Since symptoms are sporadic at the current time and she would not be interested in surgery even if she had a small tear we will get a defer the MRI even though it may be necessary in future and try all the above first and continue on the same path.  Patient will be seen back to reevaluate as needed and is encouraged to resume activities

## 2020-10-10 ENCOUNTER — Other Ambulatory Visit: Payer: Self-pay | Admitting: Podiatry

## 2020-10-10 ENCOUNTER — Telehealth: Payer: Self-pay | Admitting: *Deleted

## 2020-10-10 DIAGNOSIS — T148XXA Other injury of unspecified body region, initial encounter: Secondary | ICD-10-CM

## 2020-10-10 DIAGNOSIS — M7671 Peroneal tendinitis, right leg: Secondary | ICD-10-CM

## 2020-10-10 NOTE — Telephone Encounter (Signed)
Ordered the mri and she can come in for orthotic casting

## 2020-10-10 NOTE — Telephone Encounter (Signed)
Patient is calling to request non surgical treatments of her right ankle.The symptoms still persist since visit on 04/06 and would like to schedule MRI w/ the possibility of Orthotic device instead of surgery at this point. Please advice.

## 2020-10-11 NOTE — Telephone Encounter (Signed)
Called patient to informed that the MRI has been ordered and someone will contact her once approval has been completed.  She has scheduled/confirmed appointment  for the Orthotic casting  10/17/20@10 :00.

## 2020-10-12 ENCOUNTER — Telehealth: Payer: Self-pay | Admitting: Podiatry

## 2020-10-12 NOTE — Telephone Encounter (Signed)
Pt called concerned about her appt next week for the orthotic fitting. She stated she has not been scheduled for the mri yet and should she have the orthotic fitting prior to the mri or wait until after mri to have the fitting for the orthotics.

## 2020-10-12 NOTE — Telephone Encounter (Signed)
I called pt and we discussed why she was having the mri and she said it was for a possible tear and I told her it would be ok to go ahead with the orthotics because he is not going to be doing and procedures that may change your foot.She is keeping the appt for the orthotics for Wednesday. It has been taken care of. She is just waiting to here about a date from the mri and the order is in the chart.

## 2020-10-12 NOTE — Telephone Encounter (Signed)
Fine to be fitted. Will need no matter what

## 2020-10-17 ENCOUNTER — Ambulatory Visit (INDEPENDENT_AMBULATORY_CARE_PROVIDER_SITE_OTHER): Payer: Federal, State, Local not specified - PPO | Admitting: Podiatry

## 2020-10-17 ENCOUNTER — Other Ambulatory Visit: Payer: Self-pay

## 2020-10-17 DIAGNOSIS — M7671 Peroneal tendinitis, right leg: Secondary | ICD-10-CM

## 2020-10-17 DIAGNOSIS — M25571 Pain in right ankle and joints of right foot: Secondary | ICD-10-CM

## 2020-10-17 DIAGNOSIS — M7672 Peroneal tendinitis, left leg: Secondary | ICD-10-CM

## 2020-10-17 DIAGNOSIS — T148XXA Other injury of unspecified body region, initial encounter: Secondary | ICD-10-CM

## 2020-10-17 NOTE — Progress Notes (Signed)
Patient presents to be casted for orthotics.  A foam impression was casted for her right and left foot.  Patient is a size 9  Patient will be contacted when the orthotics are ready for pick up.

## 2020-10-21 ENCOUNTER — Ambulatory Visit
Admission: RE | Admit: 2020-10-21 | Discharge: 2020-10-21 | Disposition: A | Discharge status: Home / Self Care | Payer: Federal, State, Local not specified - PPO | Source: Ambulatory Visit | Attending: Podiatry | Admitting: Podiatry

## 2020-10-21 DIAGNOSIS — T148XXA Other injury of unspecified body region, initial encounter: Secondary | ICD-10-CM

## 2020-10-21 DIAGNOSIS — M7671 Peroneal tendinitis, right leg: Secondary | ICD-10-CM

## 2020-10-24 NOTE — Progress Notes (Signed)
Peroneal tendon tear

## 2020-10-25 ENCOUNTER — Telehealth: Payer: Self-pay | Admitting: Podiatry

## 2020-10-25 NOTE — Telephone Encounter (Signed)
I'm going out of town. She does have a tear of the tendon and will probably need surgery. I discussed  with Dr. Posey Pronto and she is going to see him as he corrects a lot of this problem

## 2020-10-25 NOTE — Telephone Encounter (Signed)
Patient requesting a call back to discus MRI results. Please advise if you would like patient to be scheduled to discuss results.

## 2020-11-02 ENCOUNTER — Ambulatory Visit (INDEPENDENT_AMBULATORY_CARE_PROVIDER_SITE_OTHER): Payer: Federal, State, Local not specified - PPO | Admitting: Podiatry

## 2020-11-02 ENCOUNTER — Other Ambulatory Visit: Payer: Self-pay

## 2020-11-02 DIAGNOSIS — M7671 Peroneal tendinitis, right leg: Secondary | ICD-10-CM

## 2020-11-05 ENCOUNTER — Telehealth: Payer: Self-pay | Admitting: Podiatry

## 2020-11-05 NOTE — Telephone Encounter (Signed)
Orthotics in.. lvm for pt to call to schedule an appt to pick them up. °

## 2020-11-06 ENCOUNTER — Telehealth: Payer: Self-pay | Admitting: *Deleted

## 2020-11-06 NOTE — Telephone Encounter (Signed)
Returned call to patient and gave Dr Serita Grit recommendations, verbalized understanding.

## 2020-11-06 NOTE — Telephone Encounter (Signed)
Patient is calling because she wants to know how long should she have to wear the brace since she is having no pain w/ the tendonitis and does she have to wear it daily?Please advise.

## 2020-11-07 ENCOUNTER — Encounter: Payer: Self-pay | Admitting: Podiatry

## 2020-11-07 ENCOUNTER — Other Ambulatory Visit: Payer: Self-pay

## 2020-11-07 ENCOUNTER — Ambulatory Visit: Payer: Medicare Other

## 2020-11-07 DIAGNOSIS — M7671 Peroneal tendinitis, right leg: Secondary | ICD-10-CM

## 2020-11-07 NOTE — Progress Notes (Signed)
Patient was contact to come and pick up orthotics.  They were inserted in shoes and patient reported no issues in office when orthotics were in shoes.  Patient was informed if there is any need of adjustment she can contact the office to get on casting schedule.

## 2020-11-07 NOTE — Progress Notes (Signed)
Subjective:  Patient ID: Ashley Solomon, female    DOB: Jul 14, 1934,  MRN: [id number]  Chief Complaint  Patient presents with   Foot Pain    Right foot tendinitis     86 y.o. female presents with the above complaint.  Patient presents with right peroneal tendinitis/tearing.  She states that she has failed multiple conservative treatment options this is here to discuss surgical options if needed.  She states that she no longer has any pain she is able to tolerate regular activities.  She wanted to go over the MRI.  She denies any other acute complaints.  She has been ambulating with the boot on.   Review of Systems: Negative except as noted in the HPI. Denies N/V/F/Ch.  Past Medical History:  Diagnosis Date   Anxiety    Bloating    Borborygmi    Chronic kidney disease, stage 3 (HCC)    Colitis    Displaced fracture of distal end of left radius 07/07/2015   Elevated glucose    GERD (gastroesophageal reflux disease)    Hiatal hernia    Hyperlipidemia    Hypertension    Hyponatremia 10/31/2017   Light stools    Melanoma ([place])    Osteoporosis    Syncope 10/31/2017   Syncope, vasovagal 10/31/2017   Vitamin D deficiency     Current Outpatient Medications:    acetaminophen (TYLENOL) 500 MG tablet, Take 500 mg by mouth every 6 (six) hours as needed for mild pain or headache., Disp: , Rfl:    amLODipine (NORVASC) 2.5 MG tablet, Take 2.5 mg by mouth at bedtime. , Disp: , Rfl:    ASPIRIN 81 PO, Take 1 tablet by mouth daily., Disp: , Rfl:    CALCIUM-VITAMIN D PO, Take 600-800 tablets by mouth daily. 800, Disp: , Rfl:    Cholecalciferol (VITAMIN D PO), Take 1,000 mg by mouth daily. , Disp: , Rfl:    DULoxetine (CYMBALTA) 60 MG capsule, TAKE 1 CAPSULE BY MOUTH EVERY DAY, Disp: 90 capsule, Rfl: 1   famotidine (PEPCID) 20 MG tablet, Take 20 mg by mouth at bedtime. , Disp: , Rfl:    Fish Oil-Cholecalciferol (FISH OIL + D3 PO), Take 1,200 mg by mouth daily. , Disp: , Rfl:    PRESCRIPTION  MEDICATION, as needed. Pt reports that she has a prosthetic right knee and must have an antibiotic when she has an open wound., Disp: , Rfl:    vitamin B-12 (CYANOCOBALAMIN) 1000 MCG tablet, Take 1,000 mcg by mouth daily. , Disp: , Rfl:    vitamin C (ASCORBIC ACID) 500 MG tablet, Take 500 mg by mouth daily., Disp: , Rfl:    vitamin E 400 UNIT capsule, Take 400 mg by mouth daily. , Disp: , Rfl:   Current Facility-Administered Medications:    triamcinolone acetonide (KENALOG) 10 MG/ML injection 10 mg, 10 mg, Other, Once, Regal, Tamala Fothergill, DPM  Social History   Tobacco Use  Smoking Status Never  Smokeless Tobacco Never    Allergies  Allergen Reactions   Lexapro [Escitalopram]     "could not function"   Prozac [Fluoxetine]     loose BM's   Objective:  There were no vitals filed for this visit. There is no height or weight on file to calculate BMI. Constitutional Well developed. Well nourished.  Vascular Dorsalis pedis pulses palpable bilaterally. Posterior tibial pulses palpable bilaterally. Capillary refill normal to all digits.  No cyanosis or clubbing noted. Pedal hair growth normal.  Neurologic Normal speech. Oriented  to person, place, and time. Epicritic sensation to light touch grossly present bilaterally.  Dermatologic Nails well groomed and normal in appearance. No open wounds. No skin lesions.  Orthopedic: Mild pain on palpation along the course of the peroneal tendon.  No pain with resisted dorsiflexion eversion of the foot.  No pain with plantarflexion inversion of the foot.  No deep intra-articular pain noted.  No pain at the Achilles tendon, posterior tibial tendon, ATFL ligament   Radiographs: None MRI 1. Severe tendinosis of the peroneus longus with a high-grade partial-thickness tear and associated severe subcortical marrow edema in the anterolateral aspect of the calcaneus. 2. Severe tendinosis of the peroneus brevis with a short-segment longitudinal split  tear. 3. Mild peroneal tenosynovitis. 4. Severely attenuated anterior talofibular ligament consistent with a complete tear. Thickening of the calcaneofibular ligament consistent with prior injury without disruption.  Assessment:   1. Peroneal tendinitis of right lower extremity    Plan:  Patient was evaluated and treated and all questions answered.  Right peroneal tendinitis/tearing -I explained to the patient the etiology of peroneal tendinitis and worse treatment options were discussed.  Clinically her pain has improved considerably and no longer is in pain with ambulation.  At this point have asked her to transition to regular shoes with Tri-Lock ankle brace.  If her pain returns and if she is unable to do so then we will discuss surgical options at that time.  Her MRI was reviewed in extensive detail which show severe tearing and thickness of the peroneus brevis and longus tendon.  However in the setting of clinical improvement we will hold off on surgical intervention.  I discussed this with patient extensive detail they both state understanding. -Tri-Lock ankle brace was dispensed  No follow-ups on file.

## 2021-11-13 NOTE — Therapy (Addendum)
OUTPATIENT PHYSICAL THERAPY LOWER EXTREMITY EVALUATION   Patient Name: Ashley Solomon MRN: [id number] DOB:[birth year], 87 y.o., female Today's Date: 11/14/2021   PT End of Session - 11/14/21 1334     Visit Number 1    Date for PT Re-Evaluation 01/09/22    Authorization Type Med [employer] at 15    Progress Note Due on Visit 10    PT Start Time 1230    PT Stop Time 1311    PT Time Calculation (min) 41 min    Activity Tolerance Patient tolerated treatment well    Behavior During Therapy [facility] for tasks assessed/performed             Past Medical History:  Diagnosis Date   Anxiety    Bloating    Borborygmi    Chronic kidney disease, stage 3 (HCC)    Colitis    Displaced fracture of distal end of left radius 07/07/2015   Elevated glucose    GERD (gastroesophageal reflux disease)    Hiatal hernia    Hyperlipidemia    Hypertension    Hyponatremia 10/31/2017   Light stools    Melanoma ([place])    Osteoporosis    Syncope 10/31/2017   Syncope, vasovagal 10/31/2017   Vitamin D deficiency    Past Surgical History:  Procedure Laterality Date   ABDOMINAL HYSTERECTOMY     BREAST EXCISIONAL BIOPSY Left 2004   CATARACT EXTRACTION, BILATERAL     COLONOSCOPY     ORIF WRIST FRACTURE Left 07/08/2015   Procedure: OPEN REDUCTION INTERNAL FIXATION (ORIF) WRIST FRACTURE;  Surgeon: Roseanne Kaufman, MD;  Location: [place];  Service: Orthopedics;  Laterality: Left;   REPLACEMENT TOTAL KNEE Right    WRIST SURGERY     Patient Active Problem List   Diagnosis Date Noted   Polyneuropathy 11/16/2019   Dysesthesia 11/16/2019   Pain in both lower extremities 11/16/2019   Rectal bleed 12/08/2018   Acute colitis 12/08/2018   Benign essential HTN 12/08/2018   GERD (gastroesophageal reflux disease) 12/08/2018   Depression 12/08/2018   Syncope, vasovagal 10/31/2017   Hyponatremia 10/31/2017    PCP: [name], MD  REFERRING PROVIDER: Inez Catalina, MD  REFERRING DIAG: [phone] (ICD-10-CM) -  Arthritis of left knee  THERAPY DIAG:  Chronic pain of left knee - Plan: PT plan of care cert/re-cert  Other abnormalities of gait and mobility - Plan: PT plan of care cert/re-cert  Muscle weakness (generalized) - Plan: PT plan of care cert/re-cert  Rationale for Evaluation and Treatment Rehabilitation  ONSET DATE: chronic OA with flare-up 6 weeks ago  SUBJECTIVE:   SUBJECTIVE STATEMENT: Pt is an 87 y.o. female who presents to PT with Lt knee pain and diagnosis of OA and tear to Lt medial meniscus.  Pt reports that pain began ~6 weeks ago when she was walking her son's big dog.  Pt had steroid injection 1 week ago and this has helped somewhat and has noticed that she is able to do more with less pain.  Pt had TKA on the Rt and would like to avoid this on the Lt.    PERTINENT HISTORY: HTN, osteoporosis, anxiety, Lt radius fracture (2017), Rt TKA in 2001  PAIN:  Are you having pain? Yes: NPRS scale: up to 7/10 in the past week, today 2/10 Pain location: Lt medial knee and posterior knee  Pain description: sore, aching, swelling  Aggravating factors: standing, walking, in bed at night  Relieving factors: Voltaren gel, ice, rest  PRECAUTIONS: Other: osteoporosis  WEIGHT BEARING RESTRICTIONS No  FALLS:  Has patient fallen in last 6 months? No  LIVING ENVIRONMENT: Lives with: lives with their family Lives in: House/apartment Stairs: 1 step with rail   OCCUPATION: retired  PLOF: Independent Walking: limited to 5 minutes with Lt knee pain Standing: limited to 15 minutes   PATIENT GOALS reduce knee pain, sleep better, stand/walk longer with less pain     OBJECTIVE:   DIAGNOSTIC FINDINGS: diagnostic ultrasound-medial meniscus tear  PATIENT SURVEYS:  FOTO 32 (goal is 38)  COGNITION:  Overall cognitive status: Within functional limits for tasks assessed     SENSATION: WFL  EDEMA:  Local medial edema distal to patella  MUSCLE LENGTH: Hamstrings limited by 25%  bilaterally.   POSTURE: flexed trunk   PALPATION: Crepitus-Lt patella, palpable tenderness over Lt distal hamstrings and along Lt medial joint line   Single leg stance:  Rt 3 seconds, Lt not able to perform   LOWER EXTREMITY MMT:  MMT Right eval Left eval  Hip flexion    Hip extension    Hip abduction 4+ 4  Hip adduction    Hip internal rotation    Hip external rotation    Knee flexion 5 4  Knee extension 5 4-  Ankle dorsiflexion 4+ 4+  Ankle plantarflexion    Ankle inversion    Ankle eversion     (Blank rows = not tested)  LOWER EXTREMITY ROM:  Lt knee A/ROM extension lacking 5 degrees vs Rt    FUNCTIONAL TESTS:  5 times sit to stand: 12.67  GAIT: Distance walked: 50 Assistive device utilized: None Level of assistance: Complete Independence Comments: antalgia with reduced time spent on the Lt, weight shift to the Rt    TODAY'S TREATMENT: Date: 11/13/21 HEP established-see below    PATIENT EDUCATION:  Education details: Access Code: [email or web address] Person educated: Patient Education method: Consulting civil engineer, Media planner, and Handouts Education comprehension: verbalized understanding and returned demonstration   HOME EXERCISE PROGRAM: Access Code: [email or web address] URL: [email or web address] Date: 11/14/2021 Prepared by: Claiborne Billings  Exercises - Seated Hamstring Stretch  - 3 x daily - 7 x weekly - 1 sets - 3 reps - 20 hold - Beginner Clam  - 2 x daily - 7 x weekly - 2 sets - 10 reps - Supine Quad Set  - 2 x daily - 7 x weekly - 2 sets - 10 reps - 5 hold - Sit to Stand  - 2 x daily - 7 x weekly - 1-2 sets - 10 reps  ASSESSMENT:  CLINICAL IMPRESSION: Patient is a 87 y.o. female who was seen today for physical therapy evaluation and treatment for Lt knee pain and OA. Pt has been limiting her standing activity due to pain although this has gotten better over the past few days since receiving a steroid injection.  Pt is also waking at night with pain.  Pt with  reduced Lt knee and hip strength, antalgic gait and reduced stability bilaterally with SLS.  Reduced Lt knee extension A/ROM and palpable tenderness over Lt medial joint line.  Patient will benefit from skilled PT to address the below impairments and improve overall function.    OBJECTIVE IMPAIRMENTS Abnormal gait, decreased activity tolerance, decreased balance, decreased endurance, decreased ROM, decreased strength, hypomobility, and impaired flexibility.   ACTIVITY LIMITATIONS standing, squatting, sleeping, and stairs  PARTICIPATION LIMITATIONS: meal prep, cleaning, laundry, driving, community activity, and yard work  PERSONAL FACTORS Age and 1-2 comorbidities: Lt knee OA/meniscus tear, Rt TKA  are also affecting  patient's functional outcome.   REHAB POTENTIAL: Good  CLINICAL DECISION MAKING: Stable/uncomplicated  EVALUATION COMPLEXITY: Low   GOALS: Goals reviewed with patient? Yes  SHORT TERM GOALS: Target date: 12/12/2021  Be independent in initial HEP Baseline: Goal status: INITIAL  2.  Report > or = to 30% reduction in Lt knee pain with standing and walking  Baseline: up to 7/10 Goal status: INITIAL  3.  Sleep without interruption due to Lt knee pain Baseline:  Goal status: INITIAL  4.  Stand for > or = to 30 minutes for ADLs and self-care without limitation Baseline: 15 min Goal status: INITIAL     LONG TERM GOALS: Target date: 01/09/2022   Be independent in advanced HEP Baseline:  Goal status: INITIAL  2.  Improve FOTO to > or = to 53 Baseline: 32 Goal status: INITIAL  3.  Resume walking for exercise and tolerate 30 minutes without limitation due to Lt knee pain  Baseline: not walking for exercise and limited to 5 min Goal status: INITIAL  4.  Stand for ADLs and self-care > or = to 45 minutes without limitation Baseline:  Goal status: INITIAL  5.  Demonstrate symmetry with ambulation on level surface due to improved LE stability  Baseline:  Goal  status: INITIAL  6.  Perform single leg stance on Rt and Lt  or = to 5 seconds to improve balance  Baseline:  Goal status: INITIAL   PLAN: PT FREQUENCY: 2x/week  PT DURATION: 8 weeks  PLANNED INTERVENTIONS: Therapeutic exercises, Therapeutic activity, Neuromuscular re-education, Balance training, Gait training, Patient/Family education, Joint mobilization, Stair training, Aquatic Therapy, Dry Needling, Cryotherapy, Moist heat, Taping, Vasopneumatic device, Ionotophoresis 26m/ml Dexamethasone, and Manual therapy  PLAN FOR NEXT SESSION: review HEP, work on Lt knee and hip flexibility and stability    KSigurd Sos PT 11/14/21 1:34 PM  PHYSICAL THERAPY DISCHARGE SUMMARY  Visits from Start of Care: 1  Current functional level related to goals / functional outcomes: See above.  Pt didn't return after evaluation.    Remaining deficits: See above   Education / Equipment: Initial HEP   Patient agrees to discharge. Patient goals were not met. Patient is being discharged due to not returning since the last visit.  KSigurd Sos PT 01/19/22 11:02 AM   [place] SLa MesaGCentennial [place] [id number] # [phone] [phone]

## 2021-11-14 ENCOUNTER — Other Ambulatory Visit: Payer: Self-pay

## 2021-11-14 ENCOUNTER — Ambulatory Visit: Payer: Federal, State, Local not specified - PPO | Attending: Sports Medicine

## 2021-11-14 DIAGNOSIS — M6281 Muscle weakness (generalized): Secondary | ICD-10-CM | POA: Diagnosis present

## 2021-11-14 DIAGNOSIS — G8929 Other chronic pain: Secondary | ICD-10-CM | POA: Insufficient documentation

## 2021-11-14 DIAGNOSIS — R2689 Other abnormalities of gait and mobility: Secondary | ICD-10-CM | POA: Diagnosis present

## 2021-11-14 DIAGNOSIS — M25562 Pain in left knee: Secondary | ICD-10-CM | POA: Insufficient documentation

## 2021-11-25 ENCOUNTER — Ambulatory Visit: Payer: Federal, State, Local not specified - PPO

## 2021-12-03 ENCOUNTER — Ambulatory Visit: Payer: Federal, State, Local not specified - PPO | Attending: Sports Medicine

## 2021-12-03 DIAGNOSIS — M25562 Pain in left knee: Secondary | ICD-10-CM | POA: Insufficient documentation

## 2021-12-03 DIAGNOSIS — R2689 Other abnormalities of gait and mobility: Secondary | ICD-10-CM | POA: Insufficient documentation

## 2021-12-03 DIAGNOSIS — M6281 Muscle weakness (generalized): Secondary | ICD-10-CM | POA: Insufficient documentation

## 2021-12-03 DIAGNOSIS — G8929 Other chronic pain: Secondary | ICD-10-CM | POA: Insufficient documentation

## 2021-12-10 ENCOUNTER — Ambulatory Visit: Payer: Federal, State, Local not specified - PPO

## 2021-12-17 ENCOUNTER — Ambulatory Visit: Payer: Federal, State, Local not specified - PPO

## 2022-02-28 ENCOUNTER — Encounter (HOSPITAL_COMMUNITY): Payer: Self-pay | Admitting: Emergency Medicine

## 2022-02-28 ENCOUNTER — Emergency Department (HOSPITAL_COMMUNITY): Payer: Federal, State, Local not specified - PPO | Admitting: Certified Registered"

## 2022-02-28 ENCOUNTER — Emergency Department (HOSPITAL_COMMUNITY): Payer: Federal, State, Local not specified - PPO

## 2022-02-28 ENCOUNTER — Observation Stay (HOSPITAL_COMMUNITY)
Admission: EM | Admit: 2022-02-28 | Discharge: 2022-03-01 | Disposition: A | Discharge status: Home / Self Care | Payer: Federal, State, Local not specified - PPO | Attending: Surgery | Admitting: Surgery

## 2022-02-28 ENCOUNTER — Other Ambulatory Visit: Payer: Self-pay

## 2022-02-28 ENCOUNTER — Encounter (HOSPITAL_COMMUNITY)
Admission: EM | Disposition: A | Discharge status: Home / Self Care | Payer: Self-pay | Source: Home / Self Care | Attending: Emergency Medicine

## 2022-02-28 DIAGNOSIS — K3580 Unspecified acute appendicitis: Principal | ICD-10-CM | POA: Insufficient documentation

## 2022-02-28 DIAGNOSIS — Z9889 Other specified postprocedural states: Secondary | ICD-10-CM

## 2022-02-28 DIAGNOSIS — I129 Hypertensive chronic kidney disease with stage 1 through stage 4 chronic kidney disease, or unspecified chronic kidney disease: Secondary | ICD-10-CM | POA: Diagnosis not present

## 2022-02-28 DIAGNOSIS — N183 Chronic kidney disease, stage 3 unspecified: Secondary | ICD-10-CM | POA: Insufficient documentation

## 2022-02-28 DIAGNOSIS — Z96651 Presence of right artificial knee joint: Secondary | ICD-10-CM | POA: Insufficient documentation

## 2022-02-28 DIAGNOSIS — Z9049 Acquired absence of other specified parts of digestive tract: Secondary | ICD-10-CM

## 2022-02-28 DIAGNOSIS — R1030 Lower abdominal pain, unspecified: Secondary | ICD-10-CM | POA: Diagnosis present

## 2022-02-28 DIAGNOSIS — R55 Syncope and collapse: Secondary | ICD-10-CM | POA: Diagnosis not present

## 2022-02-28 DIAGNOSIS — Z79899 Other long term (current) drug therapy: Secondary | ICD-10-CM | POA: Diagnosis not present

## 2022-02-28 DIAGNOSIS — Z7982 Long term (current) use of aspirin: Secondary | ICD-10-CM | POA: Diagnosis not present

## 2022-02-28 DIAGNOSIS — K37 Unspecified appendicitis: Secondary | ICD-10-CM

## 2022-02-28 HISTORY — PX: LAPAROSCOPIC APPENDECTOMY: SHX408

## 2022-02-28 LAB — COMPREHENSIVE METABOLIC PANEL
ALT: 9 U/L (ref 0–44)
AST: 27 U/L (ref 15–41)
Albumin: 3.7 g/dL (ref 3.5–5.0)
Alkaline Phosphatase: 65 U/L (ref 38–126)
Anion gap: 13 (ref 5–15)
BUN: 15 mg/dL (ref 8–23)
CO2: 23 mmol/L (ref 22–32)
Calcium: 9.2 mg/dL (ref 8.9–10.3)
Chloride: 96 mmol/L — ABNORMAL LOW (ref 98–111)
Creatinine, Ser: 1.12 mg/dL — ABNORMAL HIGH (ref 0.44–1.00)
GFR, Estimated: 48 mL/min — ABNORMAL LOW (ref >60)
Glucose, Bld: 165 mg/dL — ABNORMAL HIGH (ref 70–99)
Potassium: 4.1 mmol/L (ref 3.5–5.1)
Sodium: 132 mmol/L — ABNORMAL LOW (ref 135–145)
Total Bilirubin: 1.4 mg/dL — ABNORMAL HIGH (ref 0.3–1.2)
Total Protein: 6.3 g/dL — ABNORMAL LOW (ref 6.5–8.1)

## 2022-02-28 LAB — CBC WITH DIFFERENTIAL/PLATELET
Abs Immature Granulocytes: 0.02 10*3/uL (ref 0.00–0.07)
Basophils Absolute: 0 10*3/uL (ref 0.0–0.1)
Basophils Relative: 0 %
Eosinophils Absolute: 0 10*3/uL (ref 0.0–0.5)
Eosinophils Relative: 0 %
HCT: 42 % (ref 36.0–46.0)
Hemoglobin: 13.7 g/dL (ref 12.0–15.0)
Immature Granulocytes: 0 %
Lymphocytes Relative: 11 %
Lymphs Abs: 1.2 10*3/uL (ref 0.7–4.0)
MCH: 29.8 pg (ref 26.0–34.0)
MCHC: 32.6 g/dL (ref 30.0–36.0)
MCV: 91.3 fL (ref 80.0–100.0)
Monocytes Absolute: 0.6 10*3/uL (ref 0.1–1.0)
Monocytes Relative: 6 %
Neutro Abs: 9 10*3/uL — ABNORMAL HIGH (ref 1.7–7.7)
Neutrophils Relative %: 83 %
Platelets: 213 10*3/uL (ref 150–400)
RBC: 4.6 MIL/uL (ref 3.87–5.11)
RDW: 13 % (ref 11.5–15.5)
WBC: 10.8 10*3/uL — ABNORMAL HIGH (ref 4.0–10.5)
nRBC: 0 % (ref 0.0–0.2)

## 2022-02-28 LAB — LIPASE, BLOOD: Lipase: 26 U/L (ref 11–51)

## 2022-02-28 LAB — TROPONIN I (HIGH SENSITIVITY): Troponin I (High Sensitivity): 12 ng/L (ref <18)

## 2022-02-28 SURGERY — APPENDECTOMY, LAPAROSCOPIC
Anesthesia: General | Site: Abdomen

## 2022-02-28 MED ORDER — SUCCINYLCHOLINE CHLORIDE 200 MG/10ML IV SOSY
PREFILLED_SYRINGE | INTRAVENOUS | Status: DC | PRN
  Administered 2022-02-28: 100 mg via INTRAVENOUS

## 2022-02-28 MED ORDER — PROPOFOL 10 MG/ML IV BOLUS
INTRAVENOUS | Status: AC
  Filled 2022-02-28: qty 20

## 2022-02-28 MED ORDER — PHENYLEPHRINE HCL-NACL 20-0.9 MG/250ML-% IV SOLN
INTRAVENOUS | Status: DC | PRN
  Administered 2022-02-28: 40 ug/min via INTRAVENOUS

## 2022-02-28 MED ORDER — PROPOFOL 10 MG/ML IV BOLUS
INTRAVENOUS | Status: DC | PRN
  Administered 2022-02-28: 90 mg via INTRAVENOUS

## 2022-02-28 MED ORDER — ROCURONIUM BROMIDE 10 MG/ML (PF) SYRINGE
PREFILLED_SYRINGE | INTRAVENOUS | Status: DC | PRN
  Administered 2022-02-28: 40 mg via INTRAVENOUS

## 2022-02-28 MED ORDER — FENTANYL CITRATE (PF) 250 MCG/5ML IJ SOLN
INTRAMUSCULAR | Status: DC | PRN
  Administered 2022-02-28 (×3): 50 ug via INTRAVENOUS

## 2022-02-28 MED ORDER — METRONIDAZOLE 500 MG/100ML IV SOLN
500.0000 mg | Freq: Once | INTRAVENOUS | Status: AC
  Administered 2022-02-28: 500 mg via INTRAVENOUS
  Filled 2022-02-28: qty 100

## 2022-02-28 MED ORDER — BUPIVACAINE-EPINEPHRINE (PF) 0.25% -1:200000 IJ SOLN
INTRAMUSCULAR | Status: AC
  Filled 2022-02-28: qty 30

## 2022-02-28 MED ORDER — LIDOCAINE 2% (20 MG/ML) 5 ML SYRINGE
INTRAMUSCULAR | Status: DC | PRN
  Administered 2022-02-28: 60 mg via INTRAVENOUS

## 2022-02-28 MED ORDER — LACTATED RINGERS IV SOLN: INTRAVENOUS | Status: DC | PRN

## 2022-02-28 MED ORDER — SODIUM CHLORIDE 0.9 % IV SOLN
1.0000 g | Freq: Once | INTRAVENOUS | Status: AC
  Administered 2022-02-28: 1 g via INTRAVENOUS
  Filled 2022-02-28: qty 10

## 2022-02-28 MED ORDER — IOHEXOL 350 MG/ML SOLN
75.0000 mL | Freq: Once | INTRAVENOUS | Status: AC | PRN
  Administered 2022-02-28: 75 mL via INTRAVENOUS

## 2022-02-28 MED ORDER — FENTANYL CITRATE (PF) 250 MCG/5ML IJ SOLN
INTRAMUSCULAR | Status: AC
  Filled 2022-02-28: qty 5

## 2022-02-28 SURGICAL SUPPLY — 46 items
APPLICATOR COTTON TIP 6 STRL (MISCELLANEOUS) ×2 IMPLANT
APPLICATOR COTTON TIP 6IN STRL (MISCELLANEOUS) ×2 IMPLANT
APPLIER CLIP ROT 10 11.4 M/L (STAPLE)
BAG COUNTER SPONGE SURGICOUNT (BAG) ×1 IMPLANT
CANISTER SUCT 3000ML PPV (MISCELLANEOUS) ×1 IMPLANT
CHLORAPREP W/TINT 26 (MISCELLANEOUS) ×1 IMPLANT
CLIP APPLIE ROT 10 11.4 M/L (STAPLE) IMPLANT
COVER SURGICAL LIGHT HANDLE (MISCELLANEOUS) ×1 IMPLANT
CUTTER FLEX LINEAR 45M (STAPLE) ×1 IMPLANT
DERMABOND ADVANCED .7 DNX12 (GAUZE/BANDAGES/DRESSINGS) IMPLANT
ELECT REM PT RETURN 9FT ADLT (ELECTROSURGICAL) ×1
ELECTRODE REM PT RTRN 9FT ADLT (ELECTROSURGICAL) ×1 IMPLANT
ENDOLOOP SUT PDS II  0 18 (SUTURE)
ENDOLOOP SUT PDS II 0 18 (SUTURE) IMPLANT
GAUZE SPONGE 2X2 8PLY STRL LF (GAUZE/BANDAGES/DRESSINGS) IMPLANT
GLOVE BIOGEL M STRL SZ7.5 (GLOVE) ×1 IMPLANT
GLOVE INDICATOR 8.0 STRL GRN (GLOVE) ×2 IMPLANT
GOWN STRL REUS W/ TWL LRG LVL3 (GOWN DISPOSABLE) ×2 IMPLANT
GOWN STRL REUS W/TWL 2XL LVL3 (GOWN DISPOSABLE) ×1 IMPLANT
GOWN STRL REUS W/TWL LRG LVL3 (GOWN DISPOSABLE) ×2
GRASPER SUT TROCAR 14GX15 (MISCELLANEOUS) IMPLANT
KIT BASIN OR (CUSTOM PROCEDURE TRAY) ×1 IMPLANT
KIT TURNOVER KIT B (KITS) ×1 IMPLANT
NS IRRIG 1000ML POUR BTL (IV SOLUTION) ×1 IMPLANT
PAD ARMBOARD 7.5X6 YLW CONV (MISCELLANEOUS) ×2 IMPLANT
POUCH RETRIEVAL ECOSAC 10 (ENDOMECHANICALS) IMPLANT
POUCH RETRIEVAL ECOSAC 10MM (ENDOMECHANICALS) ×1
RELOAD 45 VASCULAR/THIN (ENDOMECHANICALS) ×1 IMPLANT
RELOAD STAPLE 45 2.5 WHT GRN (ENDOMECHANICALS) IMPLANT
RELOAD STAPLE 45 3.5 BLU ETS (ENDOMECHANICALS) IMPLANT
RELOAD STAPLE TA45 3.5 REG BLU (ENDOMECHANICALS) IMPLANT
SCISSORS LAP 5X35 DISP (ENDOMECHANICALS) IMPLANT
SET IRRIG TUBING LAPAROSCOPIC (IRRIGATION / IRRIGATOR) ×1 IMPLANT
SET TUBE SMOKE EVAC HIGH FLOW (TUBING) ×1 IMPLANT
SHEARS HARMONIC ACE PLUS 36CM (ENDOMECHANICALS) ×1 IMPLANT
SLEEVE ENDOPATH XCEL 5M (ENDOMECHANICALS) ×1 IMPLANT
SLEEVE Z-THREAD 5X100MM (TROCAR) IMPLANT
SUT VICRYL 0 UR6 27IN ABS (SUTURE) IMPLANT
TOWEL GREEN STERILE (TOWEL DISPOSABLE) ×1 IMPLANT
TOWEL GREEN STERILE FF (TOWEL DISPOSABLE) ×1 IMPLANT
TRAY FOLEY W/BAG SLVR 16FR (SET/KITS/TRAYS/PACK)
TRAY FOLEY W/BAG SLVR 16FR ST (SET/KITS/TRAYS/PACK) IMPLANT
TRAY LAPAROSCOPIC MC (CUSTOM PROCEDURE TRAY) ×1 IMPLANT
TROCAR BALLN 12MMX100 BLUNT (TROCAR) IMPLANT
TROCAR XCEL BLUNT TIP 100MML (ENDOMECHANICALS) ×1 IMPLANT
TROCAR Z-THREAD OPTICAL 5X100M (TROCAR) ×1 IMPLANT

## 2022-02-28 NOTE — ED Triage Notes (Signed)
Per [facility] pt coming from home- states when she got up in the middle of the night to use bathroom she had a syncopal episode. Then when woke up this morning felt off when trying to stand. Patient c/o lower abdominal pain across whole stomach that started last night.

## 2022-02-28 NOTE — H&P (Addendum)
CC: abdominal pain  Requesting provider: Dr Truett Mainland  HPI: Ashley Solomon is an 87 y.o. female who is here for lower abdominal pain that started yesterday evening.  She states that she developed lower abdominal pain yesterday evening and tried to get out of bed the next thing she knew she had passed out and was lying on the floor.  She states that she did not hit anything.  She denies any head trauma.  She feels that she probably got a little bit lightheaded from the discomfort in her lower abdomen.  She did not have an appetite today.  Because the pain in the lower abdomen was persistent she came to the emergency room.  She had a CT abdomen pelvis and a CT head.  She states her pain is a little bit better in the lower abdomen.  Some nausea but no vomiting.  No fever or chills.  No dysuria or hematuria.  No constipation.  No chest pain or chest pressure over the past few months.  Denies TIAs or amaurosis fugax.  Prior abdominal surgery includes tubal ligation and hysterectomy.  Cares for her husband who has Alzheimer's.  Has a son in the area.  Otherwise lives independently.  Not on any blood thinners.  Has had some L knee trouble recently and has had some injections  PCP is Dr Orland Mustard with [facility]  Past Medical History:  Diagnosis Date   Anxiety    Bloating    Borborygmi    Chronic kidney disease, stage 3 (HCC)    Colitis    Displaced fracture of distal end of left radius 07/07/2015   Elevated glucose    GERD (gastroesophageal reflux disease)    Hiatal hernia    Hyperlipidemia    Hypertension    Hyponatremia 10/31/2017   Light stools    Melanoma ([place])    Osteoporosis    Syncope 10/31/2017   Syncope, vasovagal 10/31/2017   Vitamin D deficiency     Past Surgical History:  Procedure Laterality Date   ABDOMINAL HYSTERECTOMY     BREAST EXCISIONAL BIOPSY Left 2004   CATARACT EXTRACTION, BILATERAL     COLONOSCOPY     ORIF WRIST FRACTURE Left 07/08/2015   Procedure: OPEN REDUCTION  INTERNAL FIXATION (ORIF) WRIST FRACTURE;  Surgeon: Roseanne Kaufman, MD;  Location: [facility];  Service: Orthopedics;  Laterality: Left;   REPLACEMENT TOTAL KNEE Right    WRIST SURGERY      Family History  Problem Relation Age of Onset   Breast cancer Paternal Aunt     Social:  reports that she has never smoked. She has never used smokeless tobacco. She reports current alcohol use. She reports that she does not use drugs.  Allergies:  Allergies  Allergen Reactions   Lexapro [Escitalopram]     "could not function"   Prozac [Fluoxetine]     loose BM's    Medications: I have reviewed the patient's current medications.   ROS - all of the below systems have been reviewed with the patient and positives are indicated with bold text General: chills, fever or night sweats Eyes: blurry vision or double vision ENT: epistaxis or sore throat Allergy/Immunology: itchy/watery eyes or nasal congestion Hematologic/Lymphatic: bleeding problems, blood clots or swollen lymph nodes Endocrine: temperature intolerance or unexpected weight changes Breast: Ashley or changing breast lumps or nipple discharge Resp: cough, shortness of breath, or wheezing CV: chest pain or dyspnea on exertion GI: as per HPI GU: dysuria, trouble voiding, or hematuria MSK: joint  pain or joint stiffness Neuro: TIA or stroke symptoms Derm: pruritus and skin lesion changes; left forearm skin tear a few weeks ago Psych: anxiety and depression  PE Blood pressure 127/61, pulse 81, temperature 98.8 F (37.1 C), temperature source Oral, resp. rate 17, height 5' 6.5" (1.689 m), weight 64.4 kg, SpO2 95 %. Constitutional: NAD; conversant; no deformities Eyes: Moist conjunctiva; no lid lag; anicteric; PERRL Neck: Trachea midline; no thyromegaly Lungs: Normal respiratory effort; no tactile fremitus CV: RRR; no palpable thrills; no pitting edema GI: Abd soft, mild distension, TTP RLQ, old lower transverse incision, small midline  incision; no palpable hepatosplenomegaly MSK: Normal gait; no clubbing/cyanosis Psychiatric: Appropriate affect; alert and oriented x3 Lymphatic: No palpable cervical or axillary lymphadenopathy Skin:almost healed L forearm skin avulsion with steristrips. No cellulitis Neuro: GCS 15, alert x o x4; MAE, tongue midline, (October 6, [place], [place]), strength symmetric  Results for orders placed or performed during the hospital encounter of 02/28/22 (from the past 48 hour(s))  CBC with Differential     Status: Abnormal   Collection Time: 02/28/22 11:00 AM  Result Value Ref Range   WBC 10.8 (H) 4.0 - 10.5 K/uL   RBC 4.60 3.87 - 5.11 MIL/uL   Hemoglobin 13.7 12.0 - 15.0 g/dL   HCT 42.0 36.0 - 46.0 %   MCV 91.3 80.0 - 100.0 fL   MCH 29.8 26.0 - 34.0 pg   MCHC 32.6 30.0 - 36.0 g/dL   RDW 13.0 11.5 - 15.5 %   Platelets 213 150 - 400 K/uL   nRBC 0.0 0.0 - 0.2 %   Neutrophils Relative % 83 %   Neutro Abs 9.0 (H) 1.7 - 7.7 K/uL   Lymphocytes Relative 11 %   Lymphs Abs 1.2 0.7 - 4.0 K/uL   Monocytes Relative 6 %   Monocytes Absolute 0.6 0.1 - 1.0 K/uL   Eosinophils Relative 0 %   Eosinophils Absolute 0.0 0.0 - 0.5 K/uL   Basophils Relative 0 %   Basophils Absolute 0.0 0.0 - 0.1 K/uL   Immature Granulocytes 0 %   Abs Immature Granulocytes 0.02 0.00 - 0.07 K/uL    Comment: Performed at [facility], [address] [place], [place] [address]  Comprehensive metabolic panel     Status: Abnormal   Collection Time: 02/28/22 11:00 AM  Result Value Ref Range   Sodium 132 (L) 135 - 145 mmol/L   Potassium 4.1 3.5 - 5.1 mmol/L   Chloride 96 (L) 98 - 111 mmol/L   CO2 23 22 - 32 mmol/L   Glucose, Bld 165 (H) 70 - 99 mg/dL    Comment: Glucose reference range applies only to samples taken after fasting for at least 8 hours.   BUN 15 8 - 23 mg/dL   Creatinine, Ser 1.12 (H) 0.44 - 1.00 mg/dL   Calcium 9.2 8.9 - 10.3 mg/dL   Total Protein 6.3 (L) 6.5 - 8.1 g/dL   Albumin 3.7 3.5 - 5.0  g/dL   AST 27 15 - 41 U/L   ALT 9 0 - 44 U/L   Alkaline Phosphatase 65 38 - 126 U/L   Total Bilirubin 1.4 (H) 0.3 - 1.2 mg/dL   GFR, Estimated 48 (L) >60 mL/min    Comment: (NOTE) Calculated using the CKD-EPI Creatinine Equation (2021)    Anion gap 13 5 - 15    Comment: Performed at [facility] [address], [place], [place] [address]  Lipase, blood  Status: None   Collection Time: 02/28/22 11:00 AM  Result Value Ref Range   Lipase 26 11 - 51 U/L    Comment: Performed at [facility], [place] [address], [place], [place] [address]  Troponin I (High Sensitivity)     Status: None   Collection Time: 02/28/22  6:23 PM  Result Value Ref Range   Troponin I (High Sensitivity) 12 <18 ng/L    Comment: (NOTE) Elevated high sensitivity troponin I (hsTnI) values and significant  changes across serial measurements may suggest ACS but many other  chronic and acute conditions are known to elevate hsTnI results.  Refer to the "Links" section for chest pain algorithms and additional  guidance. Performed at [facility], [place] [address] [place], [place] [address]     CT Abdomen Pelvis W Contrast  Result Date: 02/28/2022 CLINICAL DATA:  Abdominal pain, bloating. Syncope. History of melanoma. EXAM: CT ABDOMEN AND PELVIS WITH CONTRAST TECHNIQUE: Multidetector CT imaging of the abdomen and pelvis was performed using the standard protocol following bolus administration of intravenous contrast. RADIATION DOSE REDUCTION: This exam was performed according to the departmental dose-optimization program which includes automated exposure control, adjustment of the mA and/or kV according to patient size and/or use of iterative reconstruction technique. CONTRAST:  62m OMNIPAQUE IOHEXOL 350 MG/ML SOLN COMPARISON:  12/07/2020. FINDINGS: Lower chest: Mild scarring/atelectasis in the lingula and bilateral lower lobes. Hepatobiliary: Liver is within normal limits. Gallbladder is unremarkable. No  intrahepatic or extrahepatic ductal dilatation. Pancreas: Within normal limits. Spleen: Within normal limits. Adrenals/Urinary Tract: Adrenal glands are within normal limits. Kidneys are within normal limits.  No hydronephrosis. Bladder is within normal limits. Stomach/Bowel: Stomach is within normal limits. No evidence of bowel obstruction. Pericolonic inflammatory changes in the right mid/lower abdomen (series 3/image 50) with suspected wall thickening/enhancement involving the distal appendix (series 3/image 82), suspicious for acute appendicitis. No appendicolith. No drainable fluid collection/abscess. No free air to suggest macroscopic perforation. Left colon is decompressed. Sigmoid diverticulosis, without evidence of diverticulitis. Vascular/Lymphatic: No evidence of abdominal aortic aneurysm. Atherosclerotic calcifications of the abdominal aorta and branch vessels. No suspicious abdominopelvic lymphadenopathy. Reproductive: Status post hysterectomy. No adnexal masses. Other: Small volume pelvic ascites. Musculoskeletal: Mild degenerative changes of the visualized thoracolumbar spine. IMPRESSION: Suspected acute appendicitis. Associated small volume pelvic ascites. No drainable fluid collection/abscess. No free air to suggest macroscopic perforation. Sigmoid diverticulosis, without evidence of diverticulitis. Electronically Signed   By: SJulian HyM.D.   On: 02/28/2022 19:19   CT Head Wo Contrast  Result Date: 02/28/2022 CLINICAL DATA:  Headaches, Ashley or worsening. EXAM: CT HEAD WITHOUT CONTRAST TECHNIQUE: Contiguous axial images were obtained from the base of the skull through the vertex without intravenous contrast. RADIATION DOSE REDUCTION: This exam was performed according to the departmental dose-optimization program which includes automated exposure control, adjustment of the mA and/or kV according to patient size and/or use of iterative reconstruction technique. COMPARISON:  None Available.  FINDINGS: Brain: An age indeterminate lacunar infarct is present in the right caudate head. Mild generalized atrophy and diffuse white matter changes are present otherwise. No acute or focal cortical abnormality is present. The basal ganglia are otherwise within normal limits. Insert normal brainstem Vascular: Atherosclerotic calcifications are present within the cavernous internal carotid arteries bilaterally. No hyperdense vessel is present. Skull: Calvarium is intact. No focal lytic or blastic lesions are present. No significant extracranial soft tissue lesion is present. Sinuses/Orbits: The paranasal sinuses and mastoid air cells are clear. Bilateral  lens replacements are noted. Globes and orbits are otherwise unremarkable. IMPRESSION: 1. 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 2. Mild generalized atrophy and diffuse white matter disease likely reflects the sequela of chronic microvascular ischemia. Electronically Signed   By: San Morelle M.D.   On: 02/28/2022 19:13   DG Chest 1 View  Result Date: 02/28/2022 CLINICAL DATA:  Loss of consciousness. EXAM: CHEST  1 VIEW COMPARISON:  Chest x-ray 10/31/2017 FINDINGS: The heart is enlarged, unchanged. There is minimal atelectasis or scarring in the left lung base. No pleural effusion or pneumothorax. No acute fractures. IMPRESSION: No acute cardiopulmonary process. Electronically Signed   By: Ronney Asters M.D.   On: 02/28/2022 18:46    Imaging: reviewed  Data reviewed : Urgent care visit from September 22; labs from July 2020; urgent office visit from September 26 and then ENT consult from September 28; discussed case with EDP as well; CT head, CT abd pelvis today; labs from today  A/P: Ashley [place] is an 87 y.o. female with  Acute appendicitis Elevated creatinine H/o HTN Possible syncopal event  Admit outpt observation IV abx IVF Repeat labs in AM - trend renal function  Patient has appendicitis.  EDP does not  believe patient has had an acute neurologic event.  She is alert and oriented x4.  She is completely neurologically intact. Thought is that her syncopal event was due to abdominal infection.   We discussed the etiology and management of acute appendicitis. We discussed operative and nonoperative management.  We discussed nonoperative management which would include admission with IV antibiotics and monitoring for response and then resumption of diet.  We discussed the the pros and cons of medical management of appendicitis.  We discussed that if she has resolution of her current appendicitis there is a possibility of recurrent appendicitis within the next year or 2 of up to 30%.  We also discussed the possibility of needing colonoscopy to verify nothing else going on if she elected nonsurgical management.  After much discussion the patient has decided to proceed with operative management.  I recommended operative management along with IV antibiotics.  We discussed laparoscopic appendectomy. We discussed the risk and benefits of surgery including but not limited to bleeding, infection, injury to surrounding structures, need to convert to an open procedure, blood clot formation, post operative abscess or wound infection, staple line complications such as leak or bleeding, hernia formation, post operative ileus, need for additional procedures, anesthesia complications, and the typical postoperative course. I explained that the patient should expect a good improvement in their symptoms.  We did discuss that even though she is a high functioning 87 year old that she may have a little bit slower recovery than the typical appendectomy patient because of her age.  She voiced understanding and wishes to proceed with surgery  Moderate level of medical decision making-moderate amount of data review and decision regarding urgent surgery  Leighton Ruff. Redmond Pulling, MD, FACS General, Bariatric, & Minimally Invasive  Surgery [facility],  [place]

## 2022-02-28 NOTE — ED Provider Notes (Signed)
[facility] EMERGENCY DEPARTMENT Provider Note  CSN: [id number] Arrival date & time: 02/28/22 7619  Chief Complaint(s) Loss of Consciousness and Abdominal Pain  HPI [name] is a 87 y.o. female with history of CKD, hypertension, hyperlipidemia presenting to the emergency department abdominal pain.  Patient reports lower abdominal pain and bloating.  She reports this began overnight.  Symptoms have been ongoing.  Denies nausea, vomiting, diarrhea.  Denies any fevers or chills.  Denies chest pain, shortness of breath.  She also notes that last night she had a syncopal event.  She reports she was getting out of bed, possibly felt lightheaded and then woke up on the ground.  She denies any pain in her extremities.  Does not think she hit her head.  No headaches.  Daughter reports patient acting at baseline.  She is unsure if she has had any event like this before.   Past Medical History Past Medical History:  Diagnosis Date   Anxiety    Bloating    Borborygmi    Chronic kidney disease, stage 3 (HCC)    Colitis    Displaced fracture of distal end of left radius 07/07/2015   Elevated glucose    GERD (gastroesophageal reflux disease)    Hiatal hernia    Hyperlipidemia    Hypertension    Hyponatremia 10/31/2017   Light stools    Melanoma ([place])    Osteoporosis    Syncope 10/31/2017   Syncope, vasovagal 10/31/2017   Vitamin D deficiency    Patient Active Problem List   Diagnosis Date Noted   Polyneuropathy 11/16/2019   Dysesthesia 11/16/2019   Pain in both lower extremities 11/16/2019   Rectal bleed 12/08/2018   Acute colitis 12/08/2018   Benign essential HTN 12/08/2018   GERD (gastroesophageal reflux disease) 12/08/2018   Depression 12/08/2018   Syncope, vasovagal 10/31/2017   Hyponatremia 10/31/2017   Home Medication(s) Prior to Admission medications   Medication Sig Start Date End Date Taking? Authorizing Provider  acetaminophen (TYLENOL) 500 MG tablet  Take 500 mg by mouth every 6 (six) hours as needed for mild pain or headache.    [provider]  amLODipine (NORVASC) 2.5 MG tablet Take 2.5 mg by mouth at bedtime.  10/22/18   [provider]  ASPIRIN 81 PO Take 1 tablet by mouth daily.    [provider]  CALCIUM-VITAMIN D PO Take 600-800 tablets by mouth daily. 800    [provider]  Cholecalciferol (VITAMIN D PO) Take 1,000 mg by mouth daily.     [provider]  diclofenac (VOLTAREN) 0.1 % ophthalmic solution 4 (four) times daily.    [provider]  DULoxetine (CYMBALTA) 60 MG capsule TAKE 1 CAPSULE BY MOUTH EVERY DAY 12/12/19   Sater, Nanine Means, MD  famotidine (PEPCID) 20 MG tablet Take 20 mg by mouth at bedtime.  11/19/18   [provider]  Fish Oil-Cholecalciferol (FISH OIL + D3 PO) Take 1,200 mg by mouth daily.     [provider]  PRESCRIPTION MEDICATION as needed. 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    [provider]  vitamin B-12 (CYANOCOBALAMIN) 1000 MCG tablet Take 1,000 mcg by mouth daily.     [provider]  vitamin C (ASCORBIC ACID) 500 MG tablet Take 500 mg by mouth daily.    [provider]  vitamin E 400 UNIT capsule Take 400 mg  by mouth daily.     [provider]                                                                                                                                    Past Surgical History Past Surgical History:  Procedure Laterality Date   ABDOMINAL HYSTERECTOMY     BREAST EXCISIONAL BIOPSY Left 2004   CATARACT EXTRACTION, BILATERAL     COLONOSCOPY     ORIF WRIST FRACTURE Left 07/08/2015   Procedure: OPEN REDUCTION INTERNAL FIXATION (ORIF) WRIST FRACTURE;  Surgeon: Roseanne Kaufman, MD;  Location: [facility];  Service: Orthopedics;  Laterality: Left;   REPLACEMENT TOTAL KNEE Right    WRIST SURGERY     Family History Family  History  Problem Relation Age of Onset   Breast cancer Paternal Aunt     Social History Social History   Tobacco Use   Smoking status: Never   Smokeless tobacco: Never  Substance Use Topics   Alcohol use: Yes    Comment: 1 glass wine twice weekly   Drug use: No   Allergies Lexapro [escitalopram] and Prozac [fluoxetine]  Review of Systems Review of Systems  All other systems reviewed and are negative.   Physical Exam Vital Signs  I have reviewed the triage vital signs BP 127/61 (BP Location: Left Arm)   Pulse 81   Temp 98.8 F (37.1 C) (Oral)   Resp 17   Ht 5' 6.5" (1.689 m)   Wt 64.4 kg   SpO2 95%   BMI 22.58 kg/m  Physical Exam Vitals and nursing note reviewed.  Constitutional:      General: She is not in acute distress.    Appearance: She is well-developed.  HENT:     Head: Normocephalic and atraumatic.     Mouth/Throat:     Mouth: Mucous membranes are moist.  Eyes:     Pupils: Pupils are equal, round, and reactive to light.  Cardiovascular:     Rate and Rhythm: Normal rate and regular rhythm.     Heart sounds: No murmur heard. Pulmonary:     Effort: Pulmonary effort is normal. No respiratory distress.     Breath sounds: Normal breath sounds.  Abdominal:     General: Abdomen is flat.     Palpations: Abdomen is soft.     Tenderness: There is abdominal tenderness in the right lower quadrant, suprapubic area and left lower quadrant.  Musculoskeletal:        General: No tenderness.     Right lower leg: No edema.     Left lower leg: No edema.  Skin:    General: Skin is warm and dry.  Neurological:     General: No focal deficit present.     Mental Status: She is alert. Mental status is at baseline.     Comments: Cranial nerves II through XII intact, strength 5 out of 5 in the  bilateral upper and lower extremities, no sensory deficit to light touch, no dysmetria on finger-nose-finger testing  Psychiatric:        Mood and Affect: Mood normal.         Behavior: Behavior normal.     ED Results and Treatments Labs (all labs ordered are listed, but only abnormal results are displayed) Labs Reviewed  CBC WITH DIFFERENTIAL/PLATELET - Abnormal; Notable for the following components:      Result Value   WBC 10.8 (*)    Neutro Abs 9.0 (*)    All other components within normal limits  COMPREHENSIVE METABOLIC PANEL - Abnormal; Notable for the following components:   Sodium 132 (*)    Chloride 96 (*)    Glucose, Bld 165 (*)    Creatinine, Ser 1.12 (*)    Total Protein 6.3 (*)    Total Bilirubin 1.4 (*)    GFR, Estimated 48 (*)    All other components within normal limits  LIPASE, BLOOD  URINALYSIS, ROUTINE W REFLEX MICROSCOPIC  TROPONIN I (HIGH SENSITIVITY)                                                                                                                          Radiology CT Abdomen Pelvis W Contrast  Result Date: 02/28/2022 CLINICAL DATA:  Abdominal pain, bloating. Syncope. History of melanoma. EXAM: CT ABDOMEN AND PELVIS WITH CONTRAST TECHNIQUE: Multidetector CT imaging of the abdomen and pelvis was performed using the standard protocol following bolus administration of intravenous contrast. RADIATION DOSE REDUCTION: This exam was performed according to the departmental dose-optimization program which includes automated exposure control, adjustment of the mA and/or kV according to patient size and/or use of iterative reconstruction technique. CONTRAST:  32m OMNIPAQUE IOHEXOL 350 MG/ML SOLN COMPARISON:  12/07/2020. FINDINGS: Lower chest: Mild scarring/atelectasis in the lingula and bilateral lower lobes. Hepatobiliary: Liver is within normal limits. Gallbladder is unremarkable. No intrahepatic or extrahepatic ductal dilatation. Pancreas: Within normal limits. Spleen: Within normal limits. Adrenals/Urinary Tract: Adrenal glands are within normal limits. Kidneys are within normal limits.  No hydronephrosis. Bladder is within normal  limits. Stomach/Bowel: Stomach is within normal limits. No evidence of bowel obstruction. Pericolonic inflammatory changes in the right mid/lower abdomen (series 3/image 50) with suspected wall thickening/enhancement involving the distal appendix (series 3/image 82), suspicious for acute appendicitis. No appendicolith. No drainable fluid collection/abscess. No free air to suggest macroscopic perforation. Left colon is decompressed. Sigmoid diverticulosis, without evidence of diverticulitis. Vascular/Lymphatic: No evidence of abdominal aortic aneurysm. Atherosclerotic calcifications of the abdominal aorta and branch vessels. No suspicious abdominopelvic lymphadenopathy. Reproductive: Status post hysterectomy. No adnexal masses. Other: Small volume pelvic ascites. Musculoskeletal: Mild degenerative changes of the visualized thoracolumbar spine. IMPRESSION: Suspected acute appendicitis. Associated small volume pelvic ascites. No drainable fluid collection/abscess. No free air to suggest macroscopic perforation. Sigmoid diverticulosis, without evidence of diverticulitis. Electronically Signed   By: SJulian HyM.D.   On: 02/28/2022 19:19   CT Head Wo Contrast  Result Date: 02/28/2022 CLINICAL DATA:  Headaches, new or worsening. EXAM: CT HEAD WITHOUT CONTRAST TECHNIQUE: Contiguous axial images were obtained from the base of the skull through the vertex without intravenous contrast. RADIATION DOSE REDUCTION: This exam was performed according to the departmental dose-optimization program which includes automated exposure control, adjustment of the mA and/or kV according to patient size and/or use of iterative reconstruction technique. COMPARISON:  None Available. FINDINGS: Brain: An age indeterminate lacunar infarct is present in the right caudate head. Mild generalized atrophy and diffuse white matter changes are present otherwise. No acute or focal cortical abnormality is present. The basal ganglia are  otherwise within normal limits. Insert normal brainstem Vascular: Atherosclerotic calcifications are present within the cavernous internal carotid arteries bilaterally. No hyperdense vessel is present. Skull: Calvarium is intact. No focal lytic or blastic lesions are present. No significant extracranial soft tissue lesion is present. Sinuses/Orbits: The paranasal sinuses and mastoid air cells are clear. Bilateral lens replacements are noted. Globes and orbits are otherwise unremarkable. IMPRESSION: 1. 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 2. Mild generalized atrophy and diffuse white matter disease likely reflects the sequela of chronic microvascular ischemia. Electronically Signed   By: San Morelle M.D.   On: 02/28/2022 19:13   DG Chest 1 View  Result Date: 02/28/2022 CLINICAL DATA:  Loss of consciousness. EXAM: CHEST  1 VIEW COMPARISON:  Chest x-ray 10/31/2017 FINDINGS: The heart is enlarged, unchanged. There is minimal atelectasis or scarring in the left lung base. No pleural effusion or pneumothorax. No acute fractures. IMPRESSION: No acute cardiopulmonary process. Electronically Signed   By: Ronney Asters M.D.   On: 02/28/2022 18:46    Pertinent labs & imaging results that were available during my care of the patient were reviewed by me and considered in my medical decision making (see MDM for details).  Medications Ordered in ED Medications  iohexol (OMNIPAQUE) 350 MG/ML injection 75 mL (75 mLs Intravenous Contrast Given 02/28/22 1852)  cefTRIAXone (ROCEPHIN) 1 g in sodium chloride 0.9 % 100 mL IVPB (0 g Intravenous Stopped 02/28/22 2050)  metroNIDAZOLE (FLAGYL) IVPB 500 mg (0 mg Intravenous Stopped 02/28/22 2157)                                                                                                                                     Procedures Procedures  (including critical care time)  Medical Decision Making / ED Course   MDM:  87 year old  female presenting to the emergency department after syncopal event last night as well as with abdominal pain.  Patient overall well-appearing.  Physical exam with lower abdominal tenderness.  Obtain CT scan to further evaluate for intra-abdominal pathology such as appendicitis, obstruction, perforation, diverticulitis.  Will obtain urinalysis to evaluate for urinary infection.    Suspect most likely cause of syncopal event is vasovagal as patient reports she just got out of bed.  No evidence of  trauma on physical exam.  Patient denies hitting her head.  Given age, patient still high risk for dangerous causes of syncope such as arrhythmia.  Given fall, will obtain CT head although low concern for intracranial injury.  Patient has no headache and is behaving at baseline.  Will reassess.   Clinical Course as of 02/28/22 2307  [facility] Feb 28, 2022  1653 Urinalysis, Routine w reflex microscopic [WS]  2005 Paged surgery. CT scan shows appendicitis. [WS]  2142 Discussed with Dr. Redmond Pulling of surgery.  He will come evaluate the patient. [WS]  2304 Dr. Redmond Pulling plans to take the patient to the OR for appendectomy.  Suspect syncopal episode was related to infection, possible orthostatic or vasovagal syncope.  CT scan of the head demonstrates old stroke, patient has no acute neurologic deficits.  She has no known history of stroke.  Advised that she will likely need work-up for this as well, other inpatient or as outpatient through her primary physician. [WS]    Clinical Course User Index [WS] Truett Mainland, Livingston Diones, MD     Additional history obtained: -Additional history obtained from family -External records from outside source obtained and reviewed including: Chart review including previous notes, labs, imaging, consultation notes   Lab Tests: -I ordered, reviewed, and interpreted labs.   The pertinent results include:   Labs Reviewed  CBC WITH DIFFERENTIAL/PLATELET - Abnormal; Notable for the following  components:      Result Value   WBC 10.8 (*)    Neutro Abs 9.0 (*)    All other components within normal limits  COMPREHENSIVE METABOLIC PANEL - Abnormal; Notable for the following components:   Sodium 132 (*)    Chloride 96 (*)    Glucose, Bld 165 (*)    Creatinine, Ser 1.12 (*)    Total Protein 6.3 (*)    Total Bilirubin 1.4 (*)    GFR, Estimated 48 (*)    All other components within normal limits  LIPASE, BLOOD  URINALYSIS, ROUTINE W REFLEX MICROSCOPIC  TROPONIN I (HIGH SENSITIVITY)      EKG   EKG Interpretation  Date/Time:  Friday February 28 2022 10:10:32 EDT Ventricular Rate:  81 PR Interval:  160 QRS Duration: 70 QT Interval:  416 QTC Calculation: 483 R Axis:   108 Text Interpretation: Normal sinus rhythm Biatrial enlargement Rightward axis Cannot rule out Anterior infarct , age undetermined Abnormal ECG When compared with ECG of 08-Dec-2018 17:58, PREVIOUS ECG IS PRESENT Confirmed by Garnette Gunner [phone]) on 02/28/2022 4:52:46 PM         Imaging Studies ordered: I ordered imaging studies including CT head and abdomen On my interpretation imaging demonstrates old stroke, possible appendicitis I independently visualized and interpreted imaging. I agree with the radiologist interpretation   Medicines ordered and prescription drug management: Meds ordered this encounter  Medications   iohexol (OMNIPAQUE) 350 MG/ML injection 75 mL   cefTRIAXone (ROCEPHIN) 1 g in sodium chloride 0.9 % 100 mL IVPB    Order Specific Question:   Antibiotic Indication:    Answer:   Bacteremia   metroNIDAZOLE (FLAGYL) IVPB 500 mg    Order Specific Question:   Antibiotic Indication:    Answer:   Intra-abdominal Infection    -I have reviewed the patients home medicines and have made adjustments as needed   Consultations Obtained: I requested consultation with the general surgeon,  and discussed lab and imaging findings as well as pertinent plan - they recommend: admit for  appendectomy  Cardiac Monitoring: The patient was maintained on a cardiac monitor.  I personally viewed and interpreted the cardiac monitored which showed an underlying rhythm of: NSR  Social Determinants of Health:  Factors impacting patients care include: lives alone   Reevaluation: After the interventions noted above, I reevaluated the patient and found that they have improved  Co morbidities that complicate the patient evaluation  Past Medical History:  Diagnosis Date   Anxiety    Bloating    Borborygmi    Chronic kidney disease, stage 3 ([place])    Colitis    Displaced fracture of distal end of left radius 07/07/2015   Elevated glucose    GERD (gastroesophageal reflux disease)    Hiatal hernia    Hyperlipidemia    Hypertension    Hyponatremia 10/31/2017   Light stools    Melanoma ([place])    Osteoporosis    Syncope 10/31/2017   Syncope, vasovagal 10/31/2017   Vitamin D deficiency       Dispostion: Admit    Final Clinical Impression(s) / ED Diagnoses Final diagnoses:  Appendicitis, unspecified appendicitis type  Near syncope     This chart was dictated using voice recognition software.  Despite best efforts to proofread,  errors can occur which can change the documentation meaning.    Cristie Hem, MD 02/28/22 [phone]

## 2022-02-28 NOTE — ED Provider Triage Note (Addendum)
Emergency Medicine Provider Triage Evaluation Note  Ashley Solomon , a 87 y.o. female  was evaluated in triage.  Pt complains of synocpal episode and abdominal pain.  Abdominal bloating started yesterday afternoon/evening, increased towards pain throughout the middle of the night.  Went to go to the bathroom all night, sat up, and slowly slumped to the floor.  Endorses losing consciousness.  Woke up on the floor, with increased abdominal pain.  Had difficulty getting up and trying to walk.  Denies fever, chills, recent infection, changes in urinary or bowel habits.  Denies N/V.  No Hx of vertigo.  Hx of vasovagal syncope, rectal bleeding, HTN.  States "I feel like I might pass out again".  Denies chest pain or shortness of breath.  Review of Systems  Positive:  Negative: See above  Physical Exam  BP 118/66 (BP Location: Left Arm)   Pulse 82   Temp 98.5 F (36.9 C) (Oral)   Resp 18   Ht 5' 6.5" (1.689 m)   Wt 64.4 kg   SpO2 97%   BMI 22.58 kg/m  Gen:   Awake though groggy, AAOx4, appears clinically fatigued and mildly dehydrated Resp:  Normal effort  MSK:   Moves extremities with some difficulty, appears grossly weak Other:  PERRLA.  Central abdominal tenderness and lateral bandlike fashion.  Abdomen soft, nondistended.  Medical Decision Making  Medically screening exam initiated at 10:56 AM.  Appropriate orders placed.  Ashley Solomon was informed that the remainder of the evaluation will be completed by another provider, this initial triage assessment does not replace that evaluation, and the importance of remaining in the ED until their evaluation is complete.    Prince Rome, PA-C 09/62/83 6629    Prince Rome, [place] 47/65/46 1100

## 2022-02-28 NOTE — Anesthesia Preprocedure Evaluation (Addendum)
Anesthesia Evaluation  Patient identified by MRN, date of birth, ID band Patient awake    Reviewed: Allergy & Precautions, NPO status , Patient's Chart, lab work & pertinent test results  Airway Mallampati: II       Dental   Pulmonary    breath sounds clear to auscultation       Cardiovascular hypertension,  Rhythm:Regular Rate:Normal     Neuro/Psych  Neuromuscular disease    GI/Hepatic Neg liver ROS, hiatal hernia,GERD  ,,  Endo/Other    Renal/GU Renal disease     Musculoskeletal   Abdominal   Peds  Hematology   Anesthesia Other Findings   Reproductive/Obstetrics                            Anesthesia Physical Anesthesia Plan  ASA: 3  Anesthesia Plan: General   Post-op Pain Management:    Induction: Intravenous  PONV Risk Score and Plan: 3 and Ondansetron and Treatment may vary due to age or medical condition  Airway Management Planned: Oral ETT  Additional Equipment:   Intra-op Plan:   Post-operative Plan: Extubation in OR  Informed Consent: I have reviewed the patients History and Physical, chart, labs and discussed the procedure including the risks, benefits and alternatives for the proposed anesthesia with the patient or authorized representative who has indicated his/her understanding and acceptance.     Dental advisory given  Plan Discussed with: CRNA and Anesthesiologist  Anesthesia Plan Comments:        Anesthesia Quick Evaluation

## 2022-02-28 NOTE — ED Notes (Signed)
Patient transported to X-ray 

## 2022-02-28 NOTE — Anesthesia Procedure Notes (Signed)
Date/Time: 02/28/2022 11:30 PM  Performed by: Josephine Igo, CRNAPre-anesthesia Checklist: Patient identified Patient Re-evaluated:Patient Re-evaluated prior to induction Oxygen Delivery Method: Circle system utilized Induction Type: IV induction, Rapid sequence and Cricoid Pressure applied Laryngoscope Size: Miller and 2 Grade View: Grade I Tube type: Oral Number of attempts: 1 Airway Equipment and Method: Patient positioned with wedge pillow and Stylet Placement Confirmation: ETT inserted through vocal cords under direct vision, positive ETCO2 and breath sounds checked- equal and bilateral Secured at: 23 cm Tube secured with: Tape Dental Injury: Teeth and Oropharynx as per pre-operative assessment

## 2022-03-01 ENCOUNTER — Other Ambulatory Visit: Payer: Self-pay

## 2022-03-01 ENCOUNTER — Encounter (HOSPITAL_COMMUNITY): Payer: Self-pay | Admitting: General Surgery

## 2022-03-01 DIAGNOSIS — Z9049 Acquired absence of other specified parts of digestive tract: Secondary | ICD-10-CM

## 2022-03-01 LAB — BASIC METABOLIC PANEL
Anion gap: 8 (ref 5–15)
BUN: 15 mg/dL (ref 8–23)
CO2: 24 mmol/L (ref 22–32)
Calcium: 8.4 mg/dL — ABNORMAL LOW (ref 8.9–10.3)
Chloride: 98 mmol/L (ref 98–111)
Creatinine, Ser: 1.02 mg/dL — ABNORMAL HIGH (ref 0.44–1.00)
GFR, Estimated: 54 mL/min — ABNORMAL LOW (ref >60)
Glucose, Bld: 139 mg/dL — ABNORMAL HIGH (ref 70–99)
Potassium: 3.5 mmol/L (ref 3.5–5.1)
Sodium: 130 mmol/L — ABNORMAL LOW (ref 135–145)

## 2022-03-01 LAB — CBC
HCT: 33.2 % — ABNORMAL LOW (ref 36.0–46.0)
Hemoglobin: 11.8 g/dL — ABNORMAL LOW (ref 12.0–15.0)
MCH: 30.9 pg (ref 26.0–34.0)
MCHC: 35.5 g/dL (ref 30.0–36.0)
MCV: 86.9 fL (ref 80.0–100.0)
Platelets: 165 10*3/uL (ref 150–400)
RBC: 3.82 MIL/uL — ABNORMAL LOW (ref 3.87–5.11)
RDW: 13.2 % (ref 11.5–15.5)
WBC: 8.7 10*3/uL (ref 4.0–10.5)
nRBC: 0 % (ref 0.0–0.2)

## 2022-03-01 MED ORDER — KCL IN DEXTROSE-NACL 20-5-0.45 MEQ/L-%-% IV SOLN
INTRAVENOUS | Status: DC
  Filled 2022-03-01: qty 1000

## 2022-03-01 MED ORDER — ALBUMIN HUMAN 5 % IV SOLN: INTRAVENOUS | Status: DC | PRN

## 2022-03-01 MED ORDER — KETOROLAC TROMETHAMINE 0.5 % OP SOLN
1.0000 [drp] | Freq: Four times a day (QID) | OPHTHALMIC | Status: DC
  Filled 2022-03-01: qty 5

## 2022-03-01 MED ORDER — SIMETHICONE 80 MG PO CHEW: 40.0000 mg | CHEWABLE_TABLET | Freq: Four times a day (QID) | ORAL | Status: DC | PRN

## 2022-03-01 MED ORDER — OXYCODONE HCL 5 MG PO TABS: 2.5000 mg | ORAL_TABLET | ORAL | Status: DC | PRN

## 2022-03-01 MED ORDER — ONDANSETRON 4 MG PO TBDP: 4.0000 mg | ORAL_TABLET | Freq: Four times a day (QID) | ORAL | Status: DC | PRN

## 2022-03-01 MED ORDER — FENTANYL CITRATE (PF) 100 MCG/2ML IJ SOLN: 25.0000 ug | INTRAMUSCULAR | Status: DC | PRN

## 2022-03-01 MED ORDER — PANTOPRAZOLE SODIUM 40 MG IV SOLR: 40.0000 mg | Freq: Every day | INTRAVENOUS | Status: DC

## 2022-03-01 MED ORDER — ONDANSETRON HCL 4 MG/2ML IJ SOLN
INTRAMUSCULAR | Status: DC | PRN
  Administered 2022-03-01: 4 mg via INTRAVENOUS

## 2022-03-01 MED ORDER — SODIUM CHLORIDE 0.9 % IR SOLN
Status: DC | PRN
  Administered 2022-03-01: 1000 mL

## 2022-03-01 MED ORDER — SUGAMMADEX SODIUM 200 MG/2ML IV SOLN
INTRAVENOUS | Status: DC | PRN
  Administered 2022-03-01: 150 mg via INTRAVENOUS

## 2022-03-01 MED ORDER — DIPHENHYDRAMINE HCL 12.5 MG/5ML PO ELIX: 12.5000 mg | ORAL_SOLUTION | Freq: Four times a day (QID) | ORAL | Status: DC | PRN

## 2022-03-01 MED ORDER — DULOXETINE HCL 60 MG PO CPEP
60.0000 mg | ORAL_CAPSULE | Freq: Every day | ORAL | Status: DC
  Administered 2022-03-01: 60 mg via ORAL
  Filled 2022-03-01: qty 1

## 2022-03-01 MED ORDER — MORPHINE SULFATE (PF) 2 MG/ML IV SOLN: 1.0000 mg | INTRAVENOUS | Status: DC | PRN

## 2022-03-01 MED ORDER — ACETAMINOPHEN 500 MG PO TABS
1000.0000 mg | ORAL_TABLET | Freq: Four times a day (QID) | ORAL | Status: DC
  Administered 2022-03-01 (×2): 1000 mg via ORAL
  Filled 2022-03-01 (×2): qty 2

## 2022-03-01 MED ORDER — ONDANSETRON HCL 4 MG/2ML IJ SOLN: 4.0000 mg | Freq: Four times a day (QID) | INTRAMUSCULAR | Status: DC | PRN

## 2022-03-01 MED ORDER — 0.9 % SODIUM CHLORIDE (POUR BTL) OPTIME
TOPICAL | Status: DC | PRN
  Administered 2022-03-01: 1000 mL

## 2022-03-01 MED ORDER — AMLODIPINE BESYLATE 2.5 MG PO TABS: 2.5000 mg | ORAL_TABLET | Freq: Every day | ORAL | Status: DC

## 2022-03-01 MED ORDER — DIPHENHYDRAMINE HCL 50 MG/ML IJ SOLN: 12.5000 mg | Freq: Four times a day (QID) | INTRAMUSCULAR | Status: DC | PRN

## 2022-03-01 MED ORDER — ENOXAPARIN SODIUM 40 MG/0.4ML IJ SOSY: 40.0000 mg | PREFILLED_SYRINGE | INTRAMUSCULAR | Status: DC

## 2022-03-01 MED ORDER — BUPIVACAINE-EPINEPHRINE 0.25% -1:200000 IJ SOLN
INTRAMUSCULAR | Status: DC | PRN
  Administered 2022-03-01: 30 mL

## 2022-03-01 MED ORDER — TRAMADOL HCL 50 MG PO TABS: 50.0000 mg | ORAL_TABLET | Freq: Four times a day (QID) | ORAL | 0 refills | Status: AC | PRN

## 2022-03-01 NOTE — Op Note (Signed)
Ashley Solomon [id number] 1935-03-22 03/01/2022  Appendectomy, Lap,  with bilateral laparoscopic TAPP block Procedure Note  Indications: The patient presented with a history of right-sided abdominal pain. A CT revealed findings consistent with acute appendicitis.  Pre-operative Diagnosis: acute appendicitis  Post-operative Diagnosis: acute suppurative appendicitis  Surgeon: Ashley Pickerel MD FACS  Assistants: none  Anesthesia: General endotracheal anesthesia   Procedure Details  The patient was seen again in the Holding Room. The risks, benefits, complications, treatment options, and expected outcomes were discussed with the patient and/or family. The possibilities of perforation of viscus, bleeding, recurrent infection, the need for additional procedures, failure to diagnose a condition, and creating a complication requiring transfusion or operation were discussed. There was concurrence with the proposed plan and informed consent was obtained. The site of surgery was properly noted. The patient was taken to Operating Room, identified as Ashley Solomon and the procedure verified as Appendectomy. A Time Out was held and the above information confirmed.  The patient was placed in the supine position and general anesthesia was induced, along with placement of orogastric tube, SCDs. The patient voided prior to surgery. The abdomen was prepped and draped in a sterile fashion. A 1.5 centimeter infraumbilical incision was made.  The umbilical stalk was elevated, and the midline fascia was incised with a #11 blade.  A Kelly clamp was used to confirm entrance into the peritoneal cavity.  A pursestring suture was passed around the incision with a 0 Vicryl.  A 38m Hasson was introduced into the abdomen and the tails of the suture were used to hold the Hasson in place.   The pneumoperitoneum was then established to steady pressure of 15 mmHg.  Additional 5 mm cannulas then placed in the left lower  quadrant of the abdomen and the suprapubic region under direct visualization. A careful evaluation of the entire abdomen was carried out. The patient was placed in Trendelenburg and left lateral decubitus position. The small intestines were retracted in the cephalad and left lateral direction away from the pelvis and right lower quadrant. The patient was found to have an inflammed appendix that was extending into the pelvis. There was no evidence of perforation.  However there was some purulent fluid in the pelvis and in the right paracolic gutter.  The appendix was carefully dissected. The appendix was was skeletonized with the harmonic scalpel.   The appendix was divided at its base using an endo-GIA stapler with a white load. No appendiceal stump was left in place. The appendix was removed from the abdomen with an Ecco bag through the umbilical port.  There was no evidence of bleeding, leakage, or complication after division of the appendix. Irrigation was also performed and irrigate suctioned from the abdomen as well.  The umbilical port site was closed with the purse string suture. The closure was viewed laparoscopically.  An additional interrupted 0 Vicryl was placed at the umbilical fascia using the PMI suture passer with laparoscopic guidance.  Local was infiltrated around the umbilicus.  I then performed a bilateral laparoscopic tap block for postoperative pain relief.  There was no residual palpable fascial defect.  The trocar site skin wounds were closed with 4-0 Monocryl.  Dermabond was applied to the skin incisions.  Instrument, sponge, and needle counts were correct at the conclusion of the case.   Findings: The appendix was found to be inflamed. There were not signs of necrosis.  There was not perforation. There was not abscess formation.  There was purulent fluid in  the pelvis and the right paracolic gutter  Estimated Blood Loss:  Minimal         Drains: none         Specimens:  appendix         Complications:  None; patient tolerated the procedure well.         Disposition: PACU - hemodynamically stable.         Condition: stable  Ashley Ruff. Redmond Pulling, MD, FACS General, Bariatric, & Minimally Invasive Surgery [facility], [place]

## 2022-03-01 NOTE — Discharge Summary (Signed)
Physician Discharge Summary    Patient ID: Ashley Solomon MRN: [id number] DOB/AGE: 10-29-1934  87 y.o.  Patient Care Team: [name], MD as PCP - General (Family Medicine) Greer Pickerel, MD as Consulting Physician (General Surgery)  Admit date: 02/28/2022  Discharge date: 03/01/2022  Hospital Stay = 0 days    Discharge Diagnoses:  Principal Problem:   Status post surgery Active Problems:   S/P laparoscopic appendectomy   1 Day Post-Op  02/28/2022 - 03/01/2022  POST-OPERATIVE DIAGNOSIS:   APPENDICITIS  SURGERY:  02/28/2022 - 03/01/2022  Procedure(s): APPENDECTOMY LAPAROSCOPIC  SURGEON:    Surgeon(s): Greer Pickerel, MD  Consults: Pharmacy and Anesthesia  Hospital Course:   The patient underwent the surgery above.  Postoperatively, the patient gradually mobilized and advanced to a solid diet.  Pain and other symptoms were treated aggressively.    By the time of discharge, the patient was walking well the hallways, eating food, having flatus.  Pain was well-controlled on an oral medications.  Based on meeting discharge criteria and continuing to recover, I felt it was safe for the patient to be discharged from the hospital to further recover with close followup. Postoperative recommendations were discussed in detail.  They are written as well.  Discharged Condition: good  Discharge Exam: Blood pressure 132/73, pulse 73, temperature 97.6 F (36.4 C), temperature source Oral, resp. rate 16, height 5' 6.5" (1.689 m), weight 64.4 kg, SpO2 98 %.  General: Pt awake/alert/oriented x4 in No acute distress Eyes: PERRL, normal EOM.  Sclera clear.  No icterus Neuro: CN II-XII intact w/o focal sensory/motor deficits. Lymph: No head/neck/groin lymphadenopathy Psych:  No delerium/psychosis/paranoia HENT: Normocephalic, Mucus membranes moist.  No thrush Neck: Supple, No tracheal deviation Chest:  No chest wall pain w good excursion CV:  Pulses intact.  Regular rhythm MS:  Normal AROM mjr joints.  No obvious deformity Abdomen: Soft.  Nondistended.  Mildly tender at incisions only.  No evidence of peritonitis.  No incarcerated hernias. Ext:  SCDs BLE.  No mjr edema.  No cyanosis Skin: No petechiae / purpura   Disposition:    Follow-up [facility]. Schedule an appointment as soon as possible for a visit in 3 week(s).   Specialty: General Surgery Why: To follow up after your operation, To follow up after your hospital stay Contact information: [address] [place] [phone]                Discharge disposition: 01-Home or Self Care       Discharge Instructions     Call MD for:   Complete by: As directed    FEVER > 101.5 F  (temperatures < 101.5 F are not significant)   Call MD for:  extreme fatigue   Complete by: As directed    Call MD for:  persistant dizziness or light-headedness   Complete by: As directed    Call MD for:  persistant nausea and vomiting   Complete by: As directed    Call MD for:  redness, tenderness, or signs of infection (pain, swelling, redness, odor or green/yellow discharge around incision site)   Complete by: As directed    Call MD for:  severe uncontrolled pain   Complete by: As directed    Diet - low sodium heart healthy   Complete by: As directed    Start with a bland diet such as soups, liquids, starchy foods, low fat foods, etc. the first  few days at home. Gradually advance to a solid, low-fat, high fiber diet by the end of the first week at home.   Add a fiber supplement to your diet (Metamucil, etc) If you feel full, bloated, or constipated, stay on a full liquid or pureed/blenderized diet for a few days until you feel better and are no longer constipated.   Discharge instructions   Complete by: As directed    See Discharge Instructions If you are not getting better after two weeks or are noticing you are getting  worse, contact our office [phone] for further advice.  We may need to adjust your medications, re-evaluate you in the office, send you to the emergency room, or see what other things we can do to help. The clinic staff is available to answer your questions during regular business hours (8:30am-5pm).  Please don't hesitate to call and ask to speak to one of our nurses for clinical concerns.    A surgeon from [facility] is always on call at the hospitals 24 hours/day If you have a medical emergency, go to the nearest emergency room or call 911.   Discharge wound care:   Complete by: As directed    It is good for closed incisions and even open wounds to be washed every day.  Shower every day.  Short baths are fine.  Wash the incisions and wounds clean with soap & water.    You may leave closed incisions open to air if it is dry.   You may cover the incision with clean gauze & replace it after your daily shower for comfort.  DERMABOND:  You have purple skin glue (Dermabond) on your incision(s).  Leave them in place, and they will fall off on their own like a scab in 2-3 weeks.  You may trim any edges that curl up with clean scissors.   Driving Restrictions   Complete by: As directed    You may drive when: - you are no longer taking narcotic prescription pain medication - you can comfortably wear a seatbelt - you can safely make sudden turns/stops without pain.   Increase activity slowly   Complete by: As directed    Start light daily activities --- self-care, walking, climbing stairs- beginning the day after surgery.  Gradually increase activities as tolerated.  Control your pain to be active.  Stop when you are tired.  Ideally, walk several times a day, eventually an hour a day.   Most people are back to most day-to-day activities in a few weeks.  It takes 4-6 weeks to get back to unrestricted, intense activity. If you can walk 30 minutes without difficulty, it is safe to try  more intense activity such as jogging, treadmill, bicycling, low-impact aerobics, swimming, etc. Save the most intensive and strenuous activity for last (Usually 4-8 weeks after surgery) such as sit-ups, heavy lifting, contact sports, etc.  Refrain from any intense heavy lifting or straining until you are off narcotics for pain control.  You will have off days, but things should improve week-by-week. DO NOT PUSH THROUGH PAIN.  Let pain be your guide: If it hurts to do something, don't do it.   Lifting restrictions   Complete by: As directed    If you can walk 30 minutes without difficulty, it is safe to try more intense activity such as jogging, treadmill, bicycling, low-impact aerobics, swimming, etc. Save the most intensive and strenuous activity for last (Usually 4-8 weeks after surgery) such as  sit-ups, heavy lifting, contact sports, etc.   Refrain from any intense heavy lifting or straining until you are off narcotics for pain control.  You will have off days, but things should improve week-by-week. DO NOT PUSH THROUGH PAIN.  Let pain be your guide: If it hurts to do something, don't do it.  Pain is your body warning you to avoid that activity for another week until the pain goes down.   May shower / Bathe   Complete by: As directed    May walk up steps   Complete by: As directed    Sexual Activity Restrictions   Complete by: As directed    You may have sexual intercourse when it is comfortable. If it hurts to do something, stop.       Allergies as of 03/01/2022       Reactions   Lexapro [escitalopram] Other (See Comments)   "could not function"   Prozac [fluoxetine] Diarrhea        Medication List     STOP taking these medications    CALCIUM-VITAMIN D PO   VITAMIN D PO       TAKE these medications    acetaminophen 500 MG tablet Commonly known as: TYLENOL Take 500 mg by mouth every 6 (six) hours as needed for mild pain or headache.   amLODipine 2.5 MG  tablet Commonly known as: NORVASC Take 2.5 mg by mouth at bedtime.   ascorbic acid 500 MG tablet Commonly known as: VITAMIN C Take 500 mg by mouth daily.   ASPIRIN 81 PO Take 1 tablet by mouth daily.   cyanocobalamin 1000 MCG tablet Commonly known as: VITAMIN B12 Take 1,000 mcg by mouth daily.   diclofenac 0.1 % ophthalmic solution Commonly known as: VOLTAREN 4 (four) times daily.   DULoxetine 30 MG capsule Commonly known as: CYMBALTA Take 30 mg by mouth 2 (two) times daily. What changed: Another medication with the same name was removed. Continue taking this medication, and follow the directions you see here.   estradiol 0.1 MG/GM vaginal cream Commonly known as: ESTRACE Place 0.5 g vaginally 2 (two) times a week.   famotidine 20 MG tablet Commonly known as: PEPCID Take 20 mg by mouth at bedtime.   fluticasone 50 MCG/ACT nasal spray Commonly known as: FLONASE Place 2 sprays into both nostrils daily.   oxybutynin 5 MG tablet Commonly known as: DITROPAN Take 5 mg by mouth 2 (two) times daily.   traMADol 50 MG tablet Commonly known as: ULTRAM Take 1-2 tablets (50-100 mg total) by mouth every 6 (six) hours as needed for moderate pain or severe pain.   vitamin E 180 MG (400 UNITS) capsule Take 400 mg by mouth daily.               Discharge Care Instructions  (From admission, onward)           Start     Ordered   03/01/22 0000  Discharge wound care:       Comments: It is good for closed incisions and even open wounds to be washed every day.  Shower every day.  Short baths are fine.  Wash the incisions and wounds clean with soap & water.    You may leave closed incisions open to air if it is dry.   You may cover the incision with clean gauze & replace it after your daily shower for comfort.  DERMABOND:  You have purple skin glue (Dermabond) on your incision(s).  Leave them in place, and they will fall off on their own like a scab in 2-3 weeks.  You may  trim any edges that curl up with clean scissors.   03/01/22 0958            Significant Diagnostic Studies:  Results for orders placed or performed during the hospital encounter of 02/28/22 (from the past 72 hour(s))  CBC with Differential     Status: Abnormal   Collection Time: 02/28/22 11:00 AM  Result Value Ref Range   WBC 10.8 (H) 4.0 - 10.5 K/uL   RBC 4.60 3.87 - 5.11 MIL/uL   Hemoglobin 13.7 12.0 - 15.0 g/dL   HCT 42.0 36.0 - 46.0 %   MCV 91.3 80.0 - 100.0 fL   MCH 29.8 26.0 - 34.0 pg   MCHC 32.6 30.0 - 36.0 g/dL   RDW 13.0 11.5 - 15.5 %   Platelets 213 150 - 400 K/uL   nRBC 0.0 0.0 - 0.2 %   Neutrophils Relative % 83 %   Neutro Abs 9.0 (H) 1.7 - 7.7 K/uL   Lymphocytes Relative 11 %   Lymphs Abs 1.2 0.7 - 4.0 K/uL   Monocytes Relative 6 %   Monocytes Absolute 0.6 0.1 - 1.0 K/uL   Eosinophils Relative 0 %   Eosinophils Absolute 0.0 0.0 - 0.5 K/uL   Basophils Relative 0 %   Basophils Absolute 0.0 0.0 - 0.1 K/uL   Immature Granulocytes 0 %   Abs Immature Granulocytes 0.02 0.00 - 0.07 K/uL    Comment: Performed at [facility], [address] [place], [place] [address]  Comprehensive metabolic panel     Status: Abnormal   Collection Time: 02/28/22 11:00 AM  Result Value Ref Range   Sodium 132 (L) 135 - 145 mmol/L   Potassium 4.1 3.5 - 5.1 mmol/L   Chloride 96 (L) 98 - 111 mmol/L   CO2 23 22 - 32 mmol/L   Glucose, Bld 165 (H) 70 - 99 mg/dL    Comment: Glucose reference range applies only to samples taken after fasting for at least 8 hours.   BUN 15 8 - 23 mg/dL   Creatinine, Ser 1.12 (H) 0.44 - 1.00 mg/dL   Calcium 9.2 8.9 - 10.3 mg/dL   Total Protein 6.3 (L) 6.5 - 8.1 g/dL   Albumin 3.7 3.5 - 5.0 g/dL   AST 27 15 - 41 U/L   ALT 9 0 - 44 U/L   Alkaline Phosphatase 65 38 - 126 U/L   Total Bilirubin 1.4 (H) 0.3 - 1.2 mg/dL   GFR, Estimated 48 (L) >60 mL/min    Comment: (NOTE) Calculated using the CKD-EPI Creatinine Equation (2021)    Anion gap 13 5 -  15    Comment: Performed at [facility] [address], [place], [place] [address]  Lipase, blood     Status: None   Collection Time: 02/28/22 11:00 AM  Result Value Ref Range   Lipase 26 11 - 51 U/L    Comment: Performed at [facility] [address] [place], [place] [address]  Troponin I (High Sensitivity)     Status: None   Collection Time: 02/28/22  6:23 PM  Result Value Ref Range   Troponin I (High Sensitivity) 12 <18 ng/L    Comment: (NOTE) Elevated high sensitivity troponin I (hsTnI) values and significant  changes across serial measurements may suggest ACS but many other  chronic and acute conditions  are known to elevate hsTnI results.  Refer to the "Links" section for chest pain algorithms and additional  guidance. Performed at [facility], [place] [address], [place], [place] [address]   Basic metabolic panel     Status: Abnormal   Collection Time: 03/01/22  1:57 AM  Result Value Ref Range   Sodium 130 (L) 135 - 145 mmol/L   Potassium 3.5 3.5 - 5.1 mmol/L   Chloride 98 98 - 111 mmol/L   CO2 24 22 - 32 mmol/L   Glucose, Bld 139 (H) 70 - 99 mg/dL    Comment: Glucose reference range applies only to samples taken after fasting for at least 8 hours.   BUN 15 8 - 23 mg/dL   Creatinine, Ser 1.02 (H) 0.44 - 1.00 mg/dL   Calcium 8.4 (L) 8.9 - 10.3 mg/dL   GFR, Estimated 54 (L) >60 mL/min    Comment: (NOTE) Calculated using the CKD-EPI Creatinine Equation (2021)    Anion gap 8 5 - 15    Comment: Performed at [facility] [address], [place], [place] [address]  CBC     Status: Abnormal   Collection Time: 03/01/22  1:57 AM  Result Value Ref Range   WBC 8.7 4.0 - 10.5 K/uL   RBC 3.82 (L) 3.87 - 5.11 MIL/uL   Hemoglobin 11.8 (L) 12.0 - 15.0 g/dL   HCT 33.2 (L) 36.0 - 46.0 %   MCV 86.9 80.0 - 100.0 fL   MCH 30.9 26.0 - 34.0 pg   MCHC 35.5 30.0 - 36.0 g/dL   RDW 13.2 11.5 - 15.5 %   Platelets 165 150 - 400 K/uL   nRBC 0.0 0.0 - 0.2 %     Comment: Performed at [facility], [place] [address] [place], [place] [address]    CT Abdomen Pelvis W Contrast  Result Date: 02/28/2022 CLINICAL DATA:  Abdominal pain, bloating. Syncope. History of melanoma. EXAM: CT ABDOMEN AND PELVIS WITH CONTRAST TECHNIQUE: Multidetector CT imaging of the abdomen and pelvis was performed using the standard protocol following bolus administration of intravenous contrast. RADIATION DOSE REDUCTION: This exam was performed according to the departmental dose-optimization program which includes automated exposure control, adjustment of the mA and/or kV according to patient size and/or use of iterative reconstruction technique. CONTRAST:  54m OMNIPAQUE IOHEXOL 350 MG/ML SOLN COMPARISON:  12/07/2020. FINDINGS: Lower chest: Mild scarring/atelectasis in the lingula and bilateral lower lobes. Hepatobiliary: Liver is within normal limits. Gallbladder is unremarkable. No intrahepatic or extrahepatic ductal dilatation. Pancreas: Within normal limits. Spleen: Within normal limits. Adrenals/Urinary Tract: Adrenal glands are within normal limits. Kidneys are within normal limits.  No hydronephrosis. Bladder is within normal limits. Stomach/Bowel: Stomach is within normal limits. No evidence of bowel obstruction. Pericolonic inflammatory changes in the right mid/lower abdomen (series 3/image 50) with suspected wall thickening/enhancement involving the distal appendix (series 3/image 82), suspicious for acute appendicitis. No appendicolith. No drainable fluid collection/abscess. No free air to suggest macroscopic perforation. Left colon is decompressed. Sigmoid diverticulosis, without evidence of diverticulitis. Vascular/Lymphatic: No evidence of abdominal aortic aneurysm. Atherosclerotic calcifications of the abdominal aorta and branch vessels. No suspicious abdominopelvic lymphadenopathy. Reproductive: Status post hysterectomy. No adnexal masses. Other: Small volume pelvic ascites.  Musculoskeletal: Mild degenerative changes of the visualized thoracolumbar spine. IMPRESSION: Suspected acute appendicitis. Associated small volume pelvic ascites. No drainable fluid collection/abscess. No free air to suggest macroscopic perforation. Sigmoid diverticulosis, without evidence of diverticulitis. Electronically Signed   By: SBertis Ruddy  [place] Pink M.D.   On: 02/28/2022 19:19   CT Head Wo Contrast  Result Date: 02/28/2022 CLINICAL DATA:  Headaches, new or worsening. EXAM: CT HEAD WITHOUT CONTRAST TECHNIQUE: Contiguous axial images were obtained from the base of the skull through the vertex without intravenous contrast. RADIATION DOSE REDUCTION: This exam was performed according to the departmental dose-optimization program which includes automated exposure control, adjustment of the mA and/or kV according to patient size and/or use of iterative reconstruction technique. COMPARISON:  None Available. FINDINGS: Brain: An age indeterminate lacunar infarct is present in the right caudate head. Mild generalized atrophy and diffuse white matter changes are present otherwise. No acute or focal cortical abnormality is present. The basal ganglia are otherwise within normal limits. Insert normal brainstem Vascular: Atherosclerotic calcifications are present within the cavernous internal carotid arteries bilaterally. No hyperdense vessel is present. Skull: Calvarium is intact. No focal lytic or blastic lesions are present. No significant extracranial soft tissue lesion is present. Sinuses/Orbits: The paranasal sinuses and mastoid air cells are clear. Bilateral lens replacements are noted. Globes and orbits are otherwise unremarkable. IMPRESSION: 1. 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 2. Mild generalized atrophy and diffuse white matter disease likely reflects the sequela of chronic microvascular ischemia. Electronically Signed   By: San Morelle M.D.   On: 02/28/2022 19:13   DG Chest  1 View  Result Date: 02/28/2022 CLINICAL DATA:  Loss of consciousness. EXAM: CHEST  1 VIEW COMPARISON:  Chest x-ray 10/31/2017 FINDINGS: The heart is enlarged, unchanged. There is minimal atelectasis or scarring in the left lung base. No pleural effusion or pneumothorax. No acute fractures. IMPRESSION: No acute cardiopulmonary process. Electronically Signed   By: Ronney Asters M.D.   On: 02/28/2022 18:46    Past Medical History:  Diagnosis Date   Anxiety    Bloating    Borborygmi    Chronic kidney disease, stage 3 (HCC)    Colitis    Displaced fracture of distal end of left radius 07/07/2015   Elevated glucose    GERD (gastroesophageal reflux disease)    Hiatal hernia    Hyperlipidemia    Hypertension    Hyponatremia 10/31/2017   Light stools    Melanoma ([facility])    Osteoporosis    Syncope 10/31/2017   Syncope, vasovagal 10/31/2017   Vitamin D deficiency     Past Surgical History:  Procedure Laterality Date   ABDOMINAL HYSTERECTOMY     BREAST EXCISIONAL BIOPSY Left 2004   CATARACT EXTRACTION, BILATERAL     COLONOSCOPY     LAPAROSCOPIC APPENDECTOMY N/A 02/28/2022   Procedure: APPENDECTOMY LAPAROSCOPIC;  Surgeon: Greer Pickerel, MD;  Location: [facility];  Service: General;  Laterality: N/A;   ORIF WRIST FRACTURE Left 07/08/2015   Procedure: OPEN REDUCTION INTERNAL FIXATION (ORIF) WRIST FRACTURE;  Surgeon: Roseanne Kaufman, MD;  Location: [facility];  Service: Orthopedics;  Laterality: Left;   REPLACEMENT TOTAL KNEE Right    WRIST SURGERY      Social History   Socioeconomic History   Marital status: Widowed    Spouse name: Not on file   Number of children: Not on file   Years of education: Not on file   Highest education level: Not on file  Occupational History   Not on file  Tobacco Use   Smoking status: Never   Smokeless tobacco: Never  Substance and Sexual Activity   Alcohol use: Yes    Comment: 1 glass wine twice weekly   Drug use: No  Sexual activity: Not on file  Other Topics  Concern   Not on file  Social History Narrative   Lives alone   Caffeine use: 1/2-1 cup coffee per day    Right handed    Social Determinants of Health   Financial Resource Strain: Not on file  Food Insecurity: No Food Insecurity (03/01/2022)   Hunger Vital Sign    Worried About Running Out of Food in the Last Year: Never true    Ran Out of Food in the Last Year: Never true  Transportation Needs: No Transportation Needs (03/01/2022)   PRAPARE - Hydrologist (Medical): No    Lack of Transportation (Non-Medical): No  Physical Activity: Not on file  Stress: Not on file  Social Connections: Not on file  Intimate Partner Violence: Not At Risk (03/01/2022)   Humiliation, Afraid, Rape, and Kick questionnaire    Fear of Current or Ex-Partner: No    Emotionally Abused: No    Physically Abused: No    Sexually Abused: No    Family History  Problem Relation Age of Onset   Breast cancer Paternal Aunt     Current Facility-Administered Medications  Medication Dose Route Frequency Provider Last Rate Last Admin   acetaminophen (TYLENOL) tablet 1,000 mg  1,000 mg Oral Q6H Greer Pickerel, MD   1,000 mg at 03/01/22 0616   amLODipine (NORVASC) tablet 2.5 mg  2.5 mg Oral QHS Greer Pickerel, MD       dextrose 5 % and 0.45 % NaCl with KCl 20 mEq/L infusion   Intravenous Continuous Greer Pickerel, MD 50 mL/hr at 03/01/22 0207 New Bag at 03/01/22 0207   diphenhydrAMINE (BENADRYL) 12.5 MG/5ML elixir 12.5 mg  12.5 mg Oral Q6H PRN Greer Pickerel, MD       Or   diphenhydrAMINE (BENADRYL) injection 12.5 mg  12.5 mg Intravenous Q6H PRN Greer Pickerel, MD       DULoxetine (CYMBALTA) DR capsule 60 mg  60 mg Oral Daily Greer Pickerel, MD   60 mg at 03/01/22 0955   [START ON 03/02/2022] enoxaparin (LOVENOX) injection 40 mg  40 mg Subcutaneous Q24H Greer Pickerel, MD       ketorolac (ACULAR) 0.5 % ophthalmic solution 1 drop  1 drop Both Eyes QID Greer Pickerel, MD       morphine (PF) 2 MG/ML  injection 1 mg  1 mg Intravenous Q2H PRN Greer Pickerel, MD       ondansetron (ZOFRAN-ODT) disintegrating tablet 4 mg  4 mg Oral Q6H PRN Greer Pickerel, MD       Or   ondansetron [facility]) injection 4 mg  4 mg Intravenous Q6H PRN Greer Pickerel, MD       oxyCODONE (Oxy IR/ROXICODONE) immediate release tablet 2.5-5 mg  2.5-5 mg Oral Q4H PRN Greer Pickerel, MD       pantoprazole (PROTONIX) injection 40 mg  40 mg Intravenous QHS Greer Pickerel, MD       simethicone [facility]) chewable tablet 40 mg  40 mg Oral Q6H PRN Greer Pickerel, MD         Allergies  Allergen Reactions   Lexapro [Escitalopram] Other (See Comments)    "could not function"   Prozac [Fluoxetine] Diarrhea    Signed:   Adin Hector, MD, FACS, MASCRS Esophageal, Gastrointestinal & Colorectal Surgery Robotic and Minimally Invasive Surgery  [facility] [address] [place], [place] [address] [phone] Fax [phone]  Main  CONTACT INFORMATION:  Weekday (9AM-5PM): Call [facility] main office at [phone]  Weeknight (5PM-9AM) or Weekend/Holiday: Check [email or web address] (password " [email or web address]") for General Surgery [facility] coverage  (Please, do not use SecureChat as it is not reliable communication to reach operating surgeons for immediate patient care given surgeries/outpatient duties/clinic/cross-coverage/off post-call which would lead to a delay in care.  Epic staff messaging available for outptient concerns, but may not be answered for 48 hours or more).     03/01/2022, 10:00 AM

## 2022-03-01 NOTE — Transfer of Care (Signed)
Immediate Anesthesia Transfer of Care Note  Patient: Ashley Solomon  Procedure(s) Performed: APPENDECTOMY LAPAROSCOPIC (Abdomen)  Patient Location: PACU  Anesthesia Type:General  Level of Consciousness: awake, alert , and oriented  Airway & Oxygen Therapy: Patient Spontanous Breathing and Patient connected to nasal cannula oxygen  Post-op Assessment: Report given to RN and Post -op Vital signs reviewed and stable  Post vital signs: Reviewed and stable  Last Vitals:  Vitals Value Taken Time  BP 167/85 03/01/22 0036  Temp 98.7   Pulse 92 03/01/22 0039  Resp 15 03/01/22 0039  SpO2 94 % 03/01/22 0039  Vitals shown include unvalidated device data.  Last Pain:  Vitals:   02/28/22 2152  TempSrc: Oral  PainSc:          Complications: No notable events documented.

## 2022-03-01 NOTE — Discharge Instructions (Signed)
################################################################  LAPAROSCOPIC SURGERY: POST OP INSTRUCTIONS  ######################################################################  EAT Gradually transition to a high fiber diet with a fiber supplement over the next few weeks after discharge.  Start with a pureed / full liquid diet (see below)  WALK Walk an hour a day.  Control your pain to do that.    CONTROL PAIN Control pain so that you can walk, sleep, tolerate sneezing/coughing, go up/down stairs.  HAVE A BOWEL MOVEMENT DAILY Keep your bowels regular to avoid problems.  OK to try a laxative to override constipation.  OK to use an antidairrheal to slow down diarrhea.  Call if not better after 2 tries  CALL IF YOU HAVE PROBLEMS/CONCERNS Call if you are still struggling despite following these instructions. Call if you have concerns not answered by these instructions  ######################################################################    DIET: Follow a light bland diet & liquids the first 24 hours after arrival home, such as soup, liquids, starches, etc.  Be sure to drink plenty of fluids.  Quickly advance to a usual solid diet within a few days.  Avoid fast food or heavy meals as your are more likely to get nauseated or have irregular bowels.  A low-fat, high-fiber diet for the rest of your life is ideal.  Take your usually prescribed home medications unless otherwise directed.  PAIN CONTROL: Pain is best controlled by a usual combination of three different methods TOGETHER: Ice/Heat Over the counter pain medication Prescription pain medication Most patients will experience some swelling and bruising around the incisions.  Ice packs or heating pads (30-60 minutes up to 6 times a day) will help. Use ice for the first few days to help decrease swelling and bruising, then switch to heat to help relax tight/sore spots and speed recovery.  Some people prefer to use ice alone, heat  alone, alternating between ice & heat.  Experiment to what works for you.  Swelling and bruising can take several weeks to resolve.   It is helpful to take an over-the-counter pain medication regularly for the first few weeks.  Choose one of the following that works best for you: Naproxen (Aleve, etc)  Two 260m tabs twice a day Ibuprofen (Advil, etc) Three 2033mtabs four times a day (every meal & bedtime) Acetaminophen (Tylenol, etc) 500-65061mour times a day (every meal & bedtime) A  prescription for pain medication (such as oxycodone, hydrocodone, tramadol, gabapentin, methocarbamol, etc) should be given to you upon discharge.  Take your pain medication as prescribed.  If you are having problems/concerns with the prescription medicine (does not control pain, nausea, vomiting, rash, itching, etc), please call us [phone] see if we need to switch you to a different pain medicine that will work better for you and/or control your side effect better. If you need a refill on your pain medication, please give us [place] hour notice.  contact your pharmacy.  They will contact our office to request authorization. Prescriptions will not be filled after 5 pm or on week-ends  Avoid getting constipated.   Between the surgery and the pain medications, it is common to experience some constipation.   Increasing fluid intake and taking a fiber supplement (such as Metamucil, Citrucel, FiberCon, MiraLax, etc) 1-2 times a day regularly will usually help prevent this problem from occurring.   A mild laxative (prune juice, Milk of Magnesia, MiraLax, etc) should be taken according to package directions if there are no bowel movements after 48 hours.   Watch out for diarrhea.  If you have many loose bowel movements, simplify your diet to bland foods & liquids for a few days.   Stop any stool softeners and decrease your fiber supplement.   Switching to mild anti-diarrheal medications (Kayopectate, Pepto Bismol) can  help.   If this worsens or does not improve, please call us.  Wash / shower every day.  You may shower over the dressings as they are waterproof.  Continue to shower over incision(s) after the dressing is off.  It is good for closed incisions and even open wounds to be washed every day.  Shower every day.  Short baths are fine.  Wash the incisions and wounds clean with soap & water.    You may leave closed incisions open to air if it is dry.   You may cover the incision with clean gauze & replace it after your daily shower for comfort.  DERMABOND:  You have purple skin glue (Dermabond) on your incision(s).  Leave them in place, and they will fall off on their own like a scab in 2-3 weeks.  You may trim any edges that curl up with clean scissors.    ACTIVITIES as tolerated:   You may resume regular (light) daily activities beginning the next day--such as daily self-care, walking, climbing stairs--gradually increasing activities as tolerated.  If you can walk 30 minutes without difficulty, it is safe to try more intense activity such as jogging, treadmill, bicycling, low-impact aerobics, swimming, etc. Save the most intensive and strenuous activity for last such as sit-ups, heavy lifting, contact sports, etc  Refrain from any heavy lifting or straining until you are off narcotics for pain control.   DO NOT PUSH THROUGH PAIN.  Let pain be your guide: If it hurts to do something, don't do it.  Pain is your body warning you to avoid that activity for another week until the pain goes down. You may drive when you are no longer taking prescription pain medication, you can comfortably wear a seatbelt, and you can safely maneuver your car and apply brakes. You may have sexual intercourse when it is comfortable.  FOLLOW UP in our office Please call [facility] at [phone] to set up an appointment to see your surgeon in the office for a follow-up appointment approximately 2-3 weeks after your surgery. Make  sure that you call for this appointment the day you arrive home to insure a convenient appointment time.  10. IF YOU HAVE DISABILITY OR FAMILY LEAVE FORMS, BRING THEM TO THE OFFICE FOR PROCESSING.  DO NOT GIVE THEM TO YOUR DOCTOR.   WHEN TO CALL us [phone]: Poor pain control Reactions / problems with new medications (rash/itching, nausea, etc)  Fever over 101.5 F (38.5 C) Inability to urinate Nausea and/or vomiting Worsening swelling or bruising Continued bleeding from incision. Increased pain, redness, or drainage from the incision   The clinic staff is available to answer your questions during regular business hours (8:30am-5pm).  Please don't hesitate to call and ask to speak to one of our nurses for clinical concerns.   If you have a medical emergency, go to the nearest emergency room or call 911.  A surgeon from [facility] is always on call at the [facility], [place], [place], [place], [place]  [address] ? MAIN: [phone] ? TOLL FREE: [phone] ?  FAX [phone] [email or web address]  ##############################################################

## 2022-03-01 NOTE — Anesthesia Postprocedure Evaluation (Signed)
Anesthesia Post Note  Patient: Ashley Solomon  Procedure(s) Performed: APPENDECTOMY LAPAROSCOPIC (Abdomen)     Patient location during evaluation: PACU Anesthesia Type: General Level of consciousness: awake Pain management: pain level controlled Respiratory status: spontaneous breathing Cardiovascular status: stable Postop Assessment: no apparent nausea or vomiting Anesthetic complications: no   No notable events documented.  Last Vitals:  Vitals:   03/01/22 0050 03/01/22 0105  BP: (!) 162/77 (!) 140/68  Pulse: 88 89  Resp: 18 15  Temp:  37.1 C  SpO2: 96% 96%    Last Pain:  Vitals:   03/01/22 0105  TempSrc:   PainSc: 0-No pain                 Adlai Sinning

## 2022-03-01 NOTE — Plan of Care (Signed)
Discharged home.

## 2022-03-05 LAB — SURGICAL PATHOLOGY

## 2022-08-15 ENCOUNTER — Other Ambulatory Visit: Payer: Self-pay | Admitting: Family Medicine

## 2022-08-15 DIAGNOSIS — M810 Age-related osteoporosis without current pathological fracture: Secondary | ICD-10-CM

## 2023-03-05 ENCOUNTER — Other Ambulatory Visit: Payer: Federal, State, Local not specified - PPO

## 2023-03-06 ENCOUNTER — Ambulatory Visit
Admission: RE | Admit: 2023-03-06 | Discharge: 2023-03-06 | Disposition: A | Discharge status: Home / Self Care | Payer: Federal, State, Local not specified - PPO | Source: Ambulatory Visit | Attending: Family Medicine | Admitting: Family Medicine

## 2023-03-06 DIAGNOSIS — M810 Age-related osteoporosis without current pathological fracture: Secondary | ICD-10-CM
